# Patient Record
Sex: Female | Born: 1985 | Race: White | Hispanic: No | Marital: Single | Smoking: Never smoker
Health system: Southern US, Community
[De-identification: ages and names within clinical notes are randomized; demographics above are authoritative.]

## PROBLEM LIST (undated history)

## (undated) DIAGNOSIS — F32A Depression, unspecified: Secondary | ICD-10-CM

## (undated) DIAGNOSIS — J45909 Unspecified asthma, uncomplicated: Secondary | ICD-10-CM

## (undated) DIAGNOSIS — R51 Headache: Secondary | ICD-10-CM

## (undated) DIAGNOSIS — S332XXA Dislocation of sacroiliac and sacrococcygeal joint, initial encounter: Secondary | ICD-10-CM

## (undated) DIAGNOSIS — S52272A Monteggia's fracture of left ulna, initial encounter for closed fracture: Secondary | ICD-10-CM

## (undated) DIAGNOSIS — S32431A Displaced fracture of anterior column [iliopubic] of right acetabulum, initial encounter for closed fracture: Secondary | ICD-10-CM

## (undated) DIAGNOSIS — F329 Major depressive disorder, single episode, unspecified: Secondary | ICD-10-CM

## (undated) DIAGNOSIS — R519 Headache, unspecified: Secondary | ICD-10-CM

## (undated) DIAGNOSIS — S3282XA Multiple fractures of pelvis without disruption of pelvic ring, initial encounter for closed fracture: Secondary | ICD-10-CM

## (undated) DIAGNOSIS — K219 Gastro-esophageal reflux disease without esophagitis: Secondary | ICD-10-CM

## (undated) HISTORY — PX: TONSILLECTOMY: SUR1361

## (undated) HISTORY — PX: TUBAL LIGATION: SHX77

## (undated) HISTORY — PX: WISDOM TOOTH EXTRACTION: SHX21

---

## 2004-08-29 ENCOUNTER — Observation Stay: Payer: Self-pay | Admitting: Obstetrics & Gynecology

## 2004-09-12 ENCOUNTER — Emergency Department: Payer: Self-pay | Admitting: Emergency Medicine

## 2004-09-29 ENCOUNTER — Inpatient Hospital Stay: Payer: Self-pay

## 2005-07-26 ENCOUNTER — Ambulatory Visit: Payer: Self-pay

## 2006-04-11 ENCOUNTER — Observation Stay: Payer: Self-pay | Admitting: Obstetrics & Gynecology

## 2006-04-21 ENCOUNTER — Inpatient Hospital Stay: Payer: Self-pay

## 2013-10-05 ENCOUNTER — Emergency Department: Payer: Self-pay | Admitting: Emergency Medicine

## 2017-02-08 ENCOUNTER — Encounter (HOSPITAL_COMMUNITY): Admission: EM | Disposition: A | Discharge status: Home Health | Payer: Self-pay | Source: Home / Self Care

## 2017-02-08 ENCOUNTER — Inpatient Hospital Stay (HOSPITAL_COMMUNITY)
Admission: EM | Admit: 2017-02-08 | Discharge: 2017-02-15 | DRG: 958 | Disposition: A | Discharge status: Home Health | Payer: Medicaid Other | Attending: Orthopedic Surgery | Admitting: Orthopedic Surgery

## 2017-02-08 ENCOUNTER — Emergency Department (HOSPITAL_COMMUNITY): Payer: Medicaid Other

## 2017-02-08 ENCOUNTER — Inpatient Hospital Stay (HOSPITAL_COMMUNITY): Payer: Medicaid Other

## 2017-02-08 ENCOUNTER — Encounter (HOSPITAL_COMMUNITY): Payer: Self-pay

## 2017-02-08 ENCOUNTER — Inpatient Hospital Stay (HOSPITAL_COMMUNITY): Payer: Medicaid Other | Admitting: Certified Registered Nurse Anesthetist

## 2017-02-08 DIAGNOSIS — S32591A Other specified fracture of right pubis, initial encounter for closed fracture: Secondary | ICD-10-CM | POA: Diagnosis present

## 2017-02-08 DIAGNOSIS — F329 Major depressive disorder, single episode, unspecified: Secondary | ICD-10-CM | POA: Diagnosis present

## 2017-02-08 DIAGNOSIS — S52272A Monteggia's fracture of left ulna, initial encounter for closed fracture: Secondary | ICD-10-CM | POA: Diagnosis present

## 2017-02-08 DIAGNOSIS — S52279A Monteggia's fracture of unspecified ulna, initial encounter for closed fracture: Secondary | ICD-10-CM

## 2017-02-08 DIAGNOSIS — S32029A Unspecified fracture of second lumbar vertebra, initial encounter for closed fracture: Secondary | ICD-10-CM | POA: Diagnosis present

## 2017-02-08 DIAGNOSIS — Y92410 Unspecified street and highway as the place of occurrence of the external cause: Secondary | ICD-10-CM | POA: Diagnosis not present

## 2017-02-08 DIAGNOSIS — Z419 Encounter for procedure for purposes other than remedying health state, unspecified: Secondary | ICD-10-CM

## 2017-02-08 DIAGNOSIS — Z6841 Body Mass Index (BMI) 40.0 and over, adult: Secondary | ICD-10-CM

## 2017-02-08 DIAGNOSIS — S332XXD Dislocation of sacroiliac and sacrococcygeal joint, subsequent encounter: Secondary | ICD-10-CM | POA: Diagnosis not present

## 2017-02-08 DIAGNOSIS — S9031XA Contusion of right foot, initial encounter: Secondary | ICD-10-CM | POA: Diagnosis present

## 2017-02-08 DIAGNOSIS — S32039A Unspecified fracture of third lumbar vertebra, initial encounter for closed fracture: Secondary | ICD-10-CM | POA: Diagnosis present

## 2017-02-08 DIAGNOSIS — S332XXA Dislocation of sacroiliac and sacrococcygeal joint, initial encounter: Secondary | ICD-10-CM | POA: Diagnosis present

## 2017-02-08 DIAGNOSIS — S3282XA Multiple fractures of pelvis without disruption of pelvic ring, initial encounter for closed fracture: Secondary | ICD-10-CM | POA: Diagnosis present

## 2017-02-08 DIAGNOSIS — S32431A Displaced fracture of anterior column [iliopubic] of right acetabulum, initial encounter for closed fracture: Principal | ICD-10-CM | POA: Diagnosis present

## 2017-02-08 DIAGNOSIS — G8918 Other acute postprocedural pain: Secondary | ICD-10-CM | POA: Diagnosis not present

## 2017-02-08 DIAGNOSIS — S32049A Unspecified fracture of fourth lumbar vertebra, initial encounter for closed fracture: Secondary | ICD-10-CM | POA: Diagnosis present

## 2017-02-08 DIAGNOSIS — E876 Hypokalemia: Secondary | ICD-10-CM | POA: Diagnosis present

## 2017-02-08 DIAGNOSIS — S32811A Multiple fractures of pelvis with unstable disruption of pelvic ring, initial encounter for closed fracture: Secondary | ICD-10-CM | POA: Diagnosis not present

## 2017-02-08 DIAGNOSIS — S32592A Other specified fracture of left pubis, initial encounter for closed fracture: Secondary | ICD-10-CM | POA: Diagnosis present

## 2017-02-08 DIAGNOSIS — S32019A Unspecified fracture of first lumbar vertebra, initial encounter for closed fracture: Secondary | ICD-10-CM | POA: Diagnosis present

## 2017-02-08 DIAGNOSIS — S53005A Unspecified dislocation of left radial head, initial encounter: Secondary | ICD-10-CM | POA: Diagnosis present

## 2017-02-08 DIAGNOSIS — S3282XD Multiple fractures of pelvis without disruption of pelvic ring, subsequent encounter for fracture with routine healing: Secondary | ICD-10-CM | POA: Diagnosis not present

## 2017-02-08 DIAGNOSIS — S3730XA Unspecified injury of urethra, initial encounter: Secondary | ICD-10-CM | POA: Diagnosis present

## 2017-02-08 DIAGNOSIS — J45909 Unspecified asthma, uncomplicated: Secondary | ICD-10-CM

## 2017-02-08 DIAGNOSIS — R52 Pain, unspecified: Secondary | ICD-10-CM

## 2017-02-08 DIAGNOSIS — D62 Acute posthemorrhagic anemia: Secondary | ICD-10-CM | POA: Diagnosis not present

## 2017-02-08 DIAGNOSIS — Z09 Encounter for follow-up examination after completed treatment for conditions other than malignant neoplasm: Secondary | ICD-10-CM

## 2017-02-08 DIAGNOSIS — D72829 Elevated white blood cell count, unspecified: Secondary | ICD-10-CM

## 2017-02-08 DIAGNOSIS — S32810A Multiple fractures of pelvis with stable disruption of pelvic ring, initial encounter for closed fracture: Secondary | ICD-10-CM

## 2017-02-08 DIAGNOSIS — S52272D Monteggia's fracture of left ulna, subsequent encounter for closed fracture with routine healing: Secondary | ICD-10-CM | POA: Diagnosis not present

## 2017-02-08 DIAGNOSIS — Z881 Allergy status to other antibiotic agents status: Secondary | ICD-10-CM

## 2017-02-08 DIAGNOSIS — R791 Abnormal coagulation profile: Secondary | ICD-10-CM

## 2017-02-08 DIAGNOSIS — S32401A Unspecified fracture of right acetabulum, initial encounter for closed fracture: Secondary | ICD-10-CM

## 2017-02-08 DIAGNOSIS — R102 Pelvic and perineal pain: Secondary | ICD-10-CM | POA: Diagnosis present

## 2017-02-08 DIAGNOSIS — F32A Depression, unspecified: Secondary | ICD-10-CM

## 2017-02-08 HISTORY — DX: Dislocation of sacroiliac and sacrococcygeal joint, initial encounter: S33.2XXA

## 2017-02-08 HISTORY — PX: EXTERNAL FIXATION PELVIS: SHX1551

## 2017-02-08 HISTORY — DX: Major depressive disorder, single episode, unspecified: F32.9

## 2017-02-08 HISTORY — DX: Gastro-esophageal reflux disease without esophagitis: K21.9

## 2017-02-08 HISTORY — DX: Depression, unspecified: F32.A

## 2017-02-08 HISTORY — DX: Headache, unspecified: R51.9

## 2017-02-08 HISTORY — DX: Multiple fractures of pelvis without disruption of pelvic ring, initial encounter for closed fracture: S32.82XA

## 2017-02-08 HISTORY — DX: Headache: R51

## 2017-02-08 HISTORY — DX: Unspecified asthma, uncomplicated: J45.909

## 2017-02-08 HISTORY — DX: Monteggia's fracture of left ulna, initial encounter for closed fracture: S52.272A

## 2017-02-08 HISTORY — DX: Displaced fracture of anterior column (iliopubic) of right acetabulum, initial encounter for closed fracture: S32.431A

## 2017-02-08 LAB — COMPREHENSIVE METABOLIC PANEL
ALK PHOS: 46 U/L (ref 38–126)
ALT: 29 U/L (ref 14–54)
ANION GAP: 13 (ref 5–15)
AST: 49 U/L — ABNORMAL HIGH (ref 15–41)
Albumin: 3.9 g/dL (ref 3.5–5.0)
BUN: 11 mg/dL (ref 6–20)
CO2: 22 mmol/L (ref 22–32)
CREATININE: 1.11 mg/dL — AB (ref 0.44–1.00)
Calcium: 9 mg/dL (ref 8.9–10.3)
Chloride: 103 mmol/L (ref 101–111)
GFR calc non Af Amer: >60 mL/min (ref >60)
Glucose, Bld: 176 mg/dL — ABNORMAL HIGH (ref 65–99)
Potassium: 3.4 mmol/L — ABNORMAL LOW (ref 3.5–5.1)
SODIUM: 138 mmol/L (ref 135–145)
TOTAL PROTEIN: 6.4 g/dL — AB (ref 6.5–8.1)
Total Bilirubin: 0.4 mg/dL (ref 0.3–1.2)

## 2017-02-08 LAB — I-STAT CHEM 8, ED
BUN: 11 mg/dL (ref 6–20)
CALCIUM ION: 1.13 mmol/L — AB (ref 1.15–1.40)
CHLORIDE: 105 mmol/L (ref 101–111)
Creatinine, Ser: 1 mg/dL (ref 0.44–1.00)
Glucose, Bld: 175 mg/dL — ABNORMAL HIGH (ref 65–99)
HCT: 36 % (ref 36.0–46.0)
HEMOGLOBIN: 12.2 g/dL (ref 12.0–15.0)
Potassium: 3.4 mmol/L — ABNORMAL LOW (ref 3.5–5.1)
SODIUM: 140 mmol/L (ref 135–145)
TCO2: 24 mmol/L (ref 0–100)

## 2017-02-08 LAB — SAMPLE TO BLOOD BANK

## 2017-02-08 LAB — CBC
HCT: 35.8 % — ABNORMAL LOW (ref 36.0–46.0)
Hemoglobin: 12.2 g/dL (ref 12.0–15.0)
MCH: 29.5 pg (ref 26.0–34.0)
MCHC: 34.1 g/dL (ref 30.0–36.0)
MCV: 86.5 fL (ref 78.0–100.0)
PLATELETS: 363 10*3/uL (ref 150–400)
RBC: 4.14 MIL/uL (ref 3.87–5.11)
RDW: 12.8 % (ref 11.5–15.5)
WBC: 37.3 10*3/uL — AB (ref 4.0–10.5)

## 2017-02-08 LAB — PROTIME-INR
INR: 1.03
Prothrombin Time: 13.5 seconds (ref 11.4–15.2)

## 2017-02-08 LAB — I-STAT CG4 LACTIC ACID, ED: Lactic Acid, Venous: 1.82 mmol/L (ref 0.5–1.9)

## 2017-02-08 LAB — ETHANOL

## 2017-02-08 LAB — I-STAT BETA HCG BLOOD, ED (NOT ORDERABLE)
I-stat hCG, quantitative: 6.9 m[IU]/mL — ABNORMAL HIGH (ref <5)
I-stat hCG, quantitative: <5 m[IU]/mL (ref <5)

## 2017-02-08 LAB — CDS SEROLOGY

## 2017-02-08 LAB — PREPARE RBC (CROSSMATCH)

## 2017-02-08 SURGERY — EXTERNAL FIXATION, PELVIS
Anesthesia: General | Site: Pelvis

## 2017-02-08 MED ORDER — ONDANSETRON HCL 4 MG/2ML IJ SOLN
4.0000 mg | Freq: Four times a day (QID) | INTRAMUSCULAR | Status: DC | PRN
  Administered 2017-02-14: 4 mg via INTRAVENOUS
  Filled 2017-02-08: qty 2

## 2017-02-08 MED ORDER — HYDROMORPHONE HCL 1 MG/ML IJ SOLN
1.0000 mg | INTRAMUSCULAR | Status: DC | PRN
  Administered 2017-02-08 – 2017-02-10 (×6): 1 mg via INTRAVENOUS
  Filled 2017-02-08 (×6): qty 1

## 2017-02-08 MED ORDER — METHOCARBAMOL 500 MG PO TABS
500.0000 mg | ORAL_TABLET | Freq: Four times a day (QID) | ORAL | Status: DC | PRN
  Administered 2017-02-08 – 2017-02-15 (×2): 500 mg via ORAL
  Filled 2017-02-08 (×2): qty 1

## 2017-02-08 MED ORDER — PHENYLEPHRINE HCL 10 MG/ML IJ SOLN
INTRAVENOUS | Status: DC | PRN
  Administered 2017-02-08: 25 ug/min via INTRAVENOUS

## 2017-02-08 MED ORDER — CEFAZOLIN IN D5W 1 GM/50ML IV SOLN
1.0000 g | Freq: Four times a day (QID) | INTRAVENOUS | Status: AC
  Administered 2017-02-09 (×3): 1 g via INTRAVENOUS
  Administered 2017-02-09: 2 g via INTRAVENOUS
  Administered 2017-02-09: 1 g via INTRAVENOUS
  Filled 2017-02-08 (×4): qty 50

## 2017-02-08 MED ORDER — CEFOTETAN DISODIUM-DEXTROSE 2-2.08 GM-% IV SOLR
INTRAVENOUS | Status: AC
  Filled 2017-02-08: qty 50

## 2017-02-08 MED ORDER — PROPOFOL 10 MG/ML IV BOLUS
INTRAVENOUS | Status: DC | PRN
  Administered 2017-02-08: 150 mg via INTRAVENOUS

## 2017-02-08 MED ORDER — MIDAZOLAM HCL 2 MG/2ML IJ SOLN
INTRAMUSCULAR | Status: AC
  Filled 2017-02-08: qty 2

## 2017-02-08 MED ORDER — PROPOFOL 10 MG/ML IV BOLUS
INTRAVENOUS | Status: AC
  Filled 2017-02-08: qty 20

## 2017-02-08 MED ORDER — FENTANYL CITRATE (PF) 250 MCG/5ML IJ SOLN
INTRAMUSCULAR | Status: AC
  Filled 2017-02-08: qty 5

## 2017-02-08 MED ORDER — PHENYLEPHRINE HCL 10 MG/ML IJ SOLN
INTRAMUSCULAR | Status: DC | PRN
  Administered 2017-02-08: 80 ug via INTRAVENOUS
  Administered 2017-02-08: 40 ug via INTRAVENOUS

## 2017-02-08 MED ORDER — PANTOPRAZOLE SODIUM 40 MG PO TBEC
40.0000 mg | DELAYED_RELEASE_TABLET | Freq: Every day | ORAL | Status: DC
Start: 2017-02-08 — End: 2017-02-16
  Administered 2017-02-10 – 2017-02-15 (×6): 40 mg via ORAL
  Filled 2017-02-08 (×6): qty 1

## 2017-02-08 MED ORDER — LACTATED RINGERS IV SOLN
INTRAVENOUS | Status: DC | PRN
  Administered 2017-02-08: 18:00:00 via INTRAVENOUS

## 2017-02-08 MED ORDER — METOCLOPRAMIDE HCL 5 MG PO TABS: 5.0000 mg | ORAL_TABLET | Freq: Three times a day (TID) | ORAL | Status: DC | PRN

## 2017-02-08 MED ORDER — ONDANSETRON HCL 4 MG/2ML IJ SOLN: 4.0000 mg | Freq: Four times a day (QID) | INTRAMUSCULAR | Status: DC | PRN

## 2017-02-08 MED ORDER — FENTANYL CITRATE (PF) 100 MCG/2ML IJ SOLN
INTRAMUSCULAR | Status: DC | PRN
  Administered 2017-02-08: 100 ug via INTRAVENOUS

## 2017-02-08 MED ORDER — ONDANSETRON HCL 4 MG/2ML IJ SOLN: 4.0000 mg | Freq: Once | INTRAMUSCULAR | Status: DC | PRN

## 2017-02-08 MED ORDER — OXYCODONE HCL 5 MG PO TABS
5.0000 mg | ORAL_TABLET | ORAL | Status: DC | PRN
  Administered 2017-02-09: 5 mg via ORAL
  Administered 2017-02-09: 10 mg via ORAL
  Filled 2017-02-08: qty 1
  Filled 2017-02-08: qty 2

## 2017-02-08 MED ORDER — ONDANSETRON HCL 4 MG PO TABS
4.0000 mg | ORAL_TABLET | Freq: Four times a day (QID) | ORAL | Status: DC | PRN
  Administered 2017-02-13: 4 mg via ORAL
  Filled 2017-02-08: qty 1

## 2017-02-08 MED ORDER — IOPAMIDOL (ISOVUE-300) INJECTION 61%
INTRAVENOUS | Status: AC
  Administered 2017-02-08: 100 mL
  Filled 2017-02-08: qty 100

## 2017-02-08 MED ORDER — ROCURONIUM BROMIDE 10 MG/ML (PF) SYRINGE
PREFILLED_SYRINGE | INTRAVENOUS | Status: AC
  Filled 2017-02-08: qty 5

## 2017-02-08 MED ORDER — DOCUSATE SODIUM 100 MG PO CAPS
100.0000 mg | ORAL_CAPSULE | Freq: Two times a day (BID) | ORAL | Status: DC
  Administered 2017-02-08 – 2017-02-15 (×13): 100 mg via ORAL
  Filled 2017-02-08 (×13): qty 1

## 2017-02-08 MED ORDER — METHOCARBAMOL 1000 MG/10ML IJ SOLN
500.0000 mg | Freq: Four times a day (QID) | INTRAVENOUS | Status: DC | PRN
  Filled 2017-02-08: qty 5

## 2017-02-08 MED ORDER — SODIUM CHLORIDE 0.9 % IV SOLN: Freq: Once | INTRAVENOUS | Status: DC

## 2017-02-08 MED ORDER — LIDOCAINE 2% (20 MG/ML) 5 ML SYRINGE
INTRAMUSCULAR | Status: AC
  Filled 2017-02-08: qty 5

## 2017-02-08 MED ORDER — ACETAMINOPHEN 325 MG PO TABS: 650.0000 mg | ORAL_TABLET | Freq: Four times a day (QID) | ORAL | Status: DC | PRN

## 2017-02-08 MED ORDER — BISACODYL 10 MG RE SUPP: 10.0000 mg | Freq: Every day | RECTAL | Status: DC | PRN

## 2017-02-08 MED ORDER — HYDROMORPHONE HCL 1 MG/ML IJ SOLN
INTRAMUSCULAR | Status: AC
  Filled 2017-02-08: qty 1

## 2017-02-08 MED ORDER — MAGNESIUM CITRATE PO SOLN: 1.0000 | Freq: Once | ORAL | Status: DC | PRN

## 2017-02-08 MED ORDER — ONDANSETRON HCL 4 MG/2ML IJ SOLN
INTRAMUSCULAR | Status: DC | PRN
  Administered 2017-02-08: 4 mg via INTRAVENOUS

## 2017-02-08 MED ORDER — ONDANSETRON HCL 4 MG/2ML IJ SOLN
INTRAMUSCULAR | Status: AC
  Administered 2017-02-08: 4 mg via INTRAVENOUS
  Filled 2017-02-08: qty 2

## 2017-02-08 MED ORDER — MEPERIDINE HCL 25 MG/ML IJ SOLN: 6.2500 mg | INTRAMUSCULAR | Status: DC | PRN

## 2017-02-08 MED ORDER — SODIUM CHLORIDE 0.9 % IV SOLN: INTRAVENOUS | Status: DC

## 2017-02-08 MED ORDER — SUGAMMADEX SODIUM 200 MG/2ML IV SOLN
INTRAVENOUS | Status: DC | PRN
  Administered 2017-02-08: 172.4 mg via INTRAVENOUS

## 2017-02-08 MED ORDER — HYDROMORPHONE HCL 1 MG/ML IJ SOLN
0.2500 mg | INTRAMUSCULAR | Status: DC | PRN
  Administered 2017-02-08 (×2): 0.25 mg via INTRAVENOUS
  Administered 2017-02-08: 0.5 mg via INTRAVENOUS

## 2017-02-08 MED ORDER — ROCURONIUM BROMIDE 100 MG/10ML IV SOLN
INTRAVENOUS | Status: DC | PRN
  Administered 2017-02-08: 30 mg via INTRAVENOUS

## 2017-02-08 MED ORDER — PANTOPRAZOLE SODIUM 40 MG IV SOLR: 40.0000 mg | Freq: Every day | INTRAVENOUS | Status: DC

## 2017-02-08 MED ORDER — SODIUM CHLORIDE 0.9 % IV SOLN
INTRAVENOUS | Status: DC
  Administered 2017-02-08 – 2017-02-09 (×5): via INTRAVENOUS

## 2017-02-08 MED ORDER — SUCCINYLCHOLINE CHLORIDE 20 MG/ML IJ SOLN
INTRAMUSCULAR | Status: DC | PRN
  Administered 2017-02-08: 100 mg via INTRAVENOUS

## 2017-02-08 MED ORDER — SODIUM CHLORIDE 0.9 % IV BOLUS (SEPSIS)
125.0000 mL | Freq: Once | INTRAVENOUS | Status: AC
  Administered 2017-02-08: 125 mL via INTRAVENOUS

## 2017-02-08 MED ORDER — FENTANYL CITRATE (PF) 100 MCG/2ML IJ SOLN
50.0000 ug | Freq: Once | INTRAMUSCULAR | Status: AC
  Administered 2017-02-08: 50 ug via INTRAVENOUS

## 2017-02-08 MED ORDER — 0.9 % SODIUM CHLORIDE (POUR BTL) OPTIME
TOPICAL | Status: DC | PRN
  Administered 2017-02-08: 1000 mL

## 2017-02-08 MED ORDER — ACETAMINOPHEN 650 MG RE SUPP: 650.0000 mg | Freq: Four times a day (QID) | RECTAL | Status: DC | PRN

## 2017-02-08 MED ORDER — SUGAMMADEX SODIUM 200 MG/2ML IV SOLN
INTRAVENOUS | Status: AC
  Filled 2017-02-08: qty 4

## 2017-02-08 MED ORDER — ONDANSETRON HCL 4 MG/2ML IJ SOLN
INTRAMUSCULAR | Status: AC
  Filled 2017-02-08: qty 2

## 2017-02-08 MED ORDER — LIDOCAINE HCL (CARDIAC) 20 MG/ML IV SOLN
INTRAVENOUS | Status: DC | PRN
  Administered 2017-02-08: 100 mg via INTRAVENOUS

## 2017-02-08 MED ORDER — POLYETHYLENE GLYCOL 3350 17 G PO PACK: 17.0000 g | PACK | Freq: Every day | ORAL | Status: DC | PRN

## 2017-02-08 MED ORDER — ONDANSETRON HCL 4 MG/2ML IJ SOLN
4.0000 mg | Freq: Once | INTRAMUSCULAR | Status: AC
  Administered 2017-02-08: 4 mg via INTRAVENOUS

## 2017-02-08 MED ORDER — HYDROMORPHONE HCL 1 MG/ML IJ SOLN: 1.0000 mg | INTRAMUSCULAR | Status: DC | PRN

## 2017-02-08 MED ORDER — SUCCINYLCHOLINE CHLORIDE 200 MG/10ML IV SOSY
PREFILLED_SYRINGE | INTRAVENOUS | Status: AC
  Filled 2017-02-08: qty 10

## 2017-02-08 MED ORDER — CEFAZOLIN SODIUM-DEXTROSE 2-3 GM-% IV SOLR
INTRAVENOUS | Status: DC | PRN
  Administered 2017-02-08: 2 g via INTRAVENOUS

## 2017-02-08 MED ORDER — METOCLOPRAMIDE HCL 5 MG/ML IJ SOLN: 5.0000 mg | Freq: Three times a day (TID) | INTRAMUSCULAR | Status: DC | PRN

## 2017-02-08 MED ORDER — MIDAZOLAM HCL 5 MG/5ML IJ SOLN
INTRAMUSCULAR | Status: DC | PRN
  Administered 2017-02-08: 2 mg via INTRAVENOUS

## 2017-02-08 MED ORDER — FENTANYL CITRATE (PF) 100 MCG/2ML IJ SOLN
INTRAMUSCULAR | Status: AC
  Administered 2017-02-08: 50 ug via INTRAVENOUS
  Filled 2017-02-08: qty 2

## 2017-02-08 MED ORDER — ONDANSETRON HCL 4 MG PO TABS: 4.0000 mg | ORAL_TABLET | Freq: Four times a day (QID) | ORAL | Status: DC | PRN

## 2017-02-08 SURGICAL SUPPLY — 39 items
BAR GLASS FIBER EXFX 11X350 (EXFIX) ×6 IMPLANT
BNDG GAUZE ELAST 4 BULKY (GAUZE/BANDAGES/DRESSINGS) ×3 IMPLANT
CLAMP BLUE BAR TO BAR (EXFIX) ×6 IMPLANT
CLAMP PIN 45MM 1 BAR (EXFIX) ×6 IMPLANT
COVER SURGICAL LIGHT HANDLE (MISCELLANEOUS) ×3 IMPLANT
DRAPE C-ARM 42X72 X-RAY (DRAPES) ×3 IMPLANT
DRAPE ORTHO SPLIT 77X108 STRL (DRAPES) ×4
DRAPE SURG ORHT 6 SPLT 77X108 (DRAPES) ×2 IMPLANT
DRAPE U-SHAPE 47X51 STRL (DRAPES) ×6 IMPLANT
DRSG ADAPTIC 3X8 NADH LF (GAUZE/BANDAGES/DRESSINGS) ×3 IMPLANT
DRSG PAD ABDOMINAL 8X10 ST (GAUZE/BANDAGES/DRESSINGS) ×3 IMPLANT
DURAPREP 26ML APPLICATOR (WOUND CARE) ×3 IMPLANT
ELECT REM PT RETURN 9FT ADLT (ELECTROSURGICAL) ×3
ELECTRODE REM PT RTRN 9FT ADLT (ELECTROSURGICAL) ×1 IMPLANT
GAUZE SPONGE 4X4 12PLY STRL (GAUZE/BANDAGES/DRESSINGS) ×3 IMPLANT
GLOVE BIOGEL PI IND STRL 6.5 (GLOVE) ×2 IMPLANT
GLOVE BIOGEL PI IND STRL 7.0 (GLOVE) ×2 IMPLANT
GLOVE BIOGEL PI IND STRL 9 (GLOVE) ×1 IMPLANT
GLOVE BIOGEL PI INDICATOR 6.5 (GLOVE) ×4
GLOVE BIOGEL PI INDICATOR 7.0 (GLOVE) ×4
GLOVE BIOGEL PI INDICATOR 9 (GLOVE) ×2
GLOVE SKINSENSE NS SZ6.5 (GLOVE) ×2
GLOVE SKINSENSE STRL SZ6.5 (GLOVE) ×1 IMPLANT
GLOVE SURG ORTHO 9.0 STRL STRW (GLOVE) ×3 IMPLANT
GOWN STRL REUS W/ TWL XL LVL3 (GOWN DISPOSABLE) ×3 IMPLANT
GOWN STRL REUS W/TWL XL LVL3 (GOWN DISPOSABLE) ×6
KIT BASIN OR (CUSTOM PROCEDURE TRAY) ×3 IMPLANT
KIT ROOM TURNOVER OR (KITS) ×3 IMPLANT
MANIFOLD NEPTUNE II (INSTRUMENTS) ×3 IMPLANT
NS IRRIG 1000ML POUR BTL (IV SOLUTION) ×3 IMPLANT
PACK GENERAL/GYN (CUSTOM PROCEDURE TRAY) ×3 IMPLANT
PAD ARMBOARD 7.5X6 YLW CONV (MISCELLANEOUS) ×9 IMPLANT
PIN HALF 5X200X85MM EXFIX (EXFIX) ×12 IMPLANT
SUT ETHILON 3 0 FSLX (SUTURE) IMPLANT
SUT VIC AB 2-0 CTB1 (SUTURE) IMPLANT
TOWEL OR 17X24 6PK STRL BLUE (TOWEL DISPOSABLE) ×3 IMPLANT
TOWEL OR 17X26 10 PK STRL BLUE (TOWEL DISPOSABLE) ×3 IMPLANT
UNDERPAD 30X30 (UNDERPADS AND DIAPERS) ×3 IMPLANT
WATER STERILE IRR 1000ML POUR (IV SOLUTION) ×3 IMPLANT
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
## 2017-02-08 NOTE — Transfer of Care (Signed)
Immediate Anesthesia Transfer of Care Note  Patient: Penny Long  Procedure(s) Performed: Procedure(s): EXTERNAL FIXATION PELVIS (N/A)  Patient Location: PACU  Anesthesia Type:General  Level of Consciousness: awake and alert   Airway & Oxygen Therapy: Patient Spontanous Breathing and Patient connected to nasal cannula oxygen  Post-op Assessment: Report given to RN and Post -op Vital signs reviewed and stable  Post vital signs: Reviewed and stable  Last Vitals:  Vitals:   02/08/17 1715 02/08/17 1923  BP: 114/75   Pulse: 74   Resp: 19   Temp:  36.2 C    Last Pain:  Vitals:   02/08/17 1606  TempSrc:   PainSc: 10-Worst pain ever         Complications: No apparent anesthesia complications
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
## 2017-02-08 NOTE — ED Notes (Addendum)
Patient to CT with RN and NT °
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
## 2017-02-08 NOTE — Progress Notes (Signed)
Sister Selena Batten  updated per pt request

## 2017-02-08 NOTE — Progress Notes (Signed)
Orthopedic Tech Progress Note Patient Details:  Penny Long 22-Dec-1985 [id number]  Ortho Devices Type of Ortho Device: Ace wrap, Post (long arm) splint Ortho Device/Splint Location: LUE Ortho Device/Splint Interventions: Ordered, Application   Jennye Moccasin 02/08/2017, 8:12 PM
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
## 2017-02-08 NOTE — Progress Notes (Signed)
Upon arrival to short stay from ED, pt requested to void.  Assisted pt to use female urinal.  Pt voided 300 cc of bloody urine.  Specimen cup saved.  Made Dr. Lajoyce Corners aware of bloody urine.

## 2017-02-08 NOTE — Anesthesia Procedure Notes (Signed)
Procedure Name: Intubation Date/Time: 02/08/2017 6:19 PM Performed by: Gwenyth Allegra Pre-anesthesia Checklist: Patient identified, Emergency Drugs available, Suction available, Patient being monitored and Timeout performed Patient Re-evaluated:Patient Re-evaluated prior to inductionOxygen Delivery Method: Circle system utilized Intubation Type: IV induction, Rapid sequence and Cricoid Pressure applied Tube size: 7.0 mm Number of attempts: 1 Airway Equipment and Method: Stylet Placement Confirmation: ETT inserted through vocal cords under direct vision,  positive ETCO2 and breath sounds checked- equal and bilateral Secured at: 21 cm Tube secured with: Tape Dental Injury: Teeth and Oropharynx as per pre-operative assessment

## 2017-02-08 NOTE — ED Notes (Signed)
All belongings with sister.
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
## 2017-02-08 NOTE — Anesthesia Preprocedure Evaluation (Addendum)
Anesthesia Evaluation  Patient identified by MRN, date of birth, ID band Patient awake    Reviewed: Allergy & Precautions, NPO status , Patient's Chart, lab work & pertinent test results  Airway Mallampati: I  TM Distance: >3 FB Neck ROM: Full    Dental   Pulmonary neg pulmonary ROS,    Pulmonary exam normal breath sounds clear to auscultation       Cardiovascular negative cardio ROS Normal cardiovascular exam Rhythm:Regular Rate:Normal     Neuro/Psych Depression negative neurological ROS  negative psych ROS   GI/Hepatic negative GI ROS, Neg liver ROS,   Endo/Other  negative endocrine ROS  Renal/GU negative Renal ROS     Musculoskeletal negative musculoskeletal ROS (+)   Abdominal   Peds  Hematology negative hematology ROS (+)   Anesthesia Other Findings   Reproductive/Obstetrics negative OB ROS                                                             Anesthesia Evaluation  Patient identified by MRN, date of birth, ID band Patient awake    Reviewed: Allergy & Precautions, NPO status , Patient's Chart, lab work & pertinent test results  Airway Mallampati: I  TM Distance: >3 FB Neck ROM: Full    Dental   Pulmonary    Pulmonary exam normal        Cardiovascular Normal cardiovascular exam     Neuro/Psych Depression    GI/Hepatic   Endo/Other    Renal/GU      Musculoskeletal   Abdominal   Peds  Hematology   Anesthesia Other Findings   Reproductive/Obstetrics                             Anesthesia Physical Anesthesia Plan  ASA: II  Anesthesia Plan: General   Post-op Pain Management:    Induction: Intravenous  Airway Management Planned: Oral ETT  Additional Equipment:   Intra-op Plan:   Post-operative Plan: Extubation in OR  Informed Consent: I have reviewed the patients History and Physical, chart, labs and  discussed the procedure including the risks, benefits and alternatives for the proposed anesthesia with the patient or authorized representative who has indicated his/her understanding and acceptance.     Plan Discussed with: CRNA and Surgeon  Anesthesia Plan Comments:         Anesthesia Quick Evaluation  Anesthesia Physical Anesthesia Plan  ASA: II  Anesthesia Plan: General   Post-op Pain Management:    Induction: Intravenous  Airway Management Planned: Oral ETT  Additional Equipment:   Intra-op Plan:   Post-operative Plan: Extubation in OR  Informed Consent: I have reviewed the patients History and Physical, chart, labs and discussed the procedure including the risks, benefits and alternatives for the proposed anesthesia with the patient or authorized representative who has indicated his/her understanding and acceptance.   Dental advisory given  Plan Discussed with: CRNA  Anesthesia Plan Comments:        Anesthesia Quick Evaluation
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
## 2017-02-09 ENCOUNTER — Inpatient Hospital Stay (HOSPITAL_COMMUNITY): Payer: Medicaid Other

## 2017-02-09 ENCOUNTER — Inpatient Hospital Stay (HOSPITAL_COMMUNITY): Payer: Medicaid Other | Admitting: Certified Registered Nurse Anesthetist

## 2017-02-09 ENCOUNTER — Encounter (HOSPITAL_COMMUNITY): Payer: Self-pay | Admitting: *Deleted

## 2017-02-09 ENCOUNTER — Encounter (HOSPITAL_COMMUNITY): Admission: EM | Disposition: A | Discharge status: Home Health | Payer: Self-pay | Source: Home / Self Care

## 2017-02-09 HISTORY — PX: ORIF PELVIC FRACTURE: SHX2128

## 2017-02-09 HISTORY — PX: ORIF ELBOW FRACTURE: SHX5031

## 2017-02-09 LAB — CBC
HCT: 26.8 % — ABNORMAL LOW (ref 36.0–46.0)
HEMATOCRIT: 26.9 % — AB (ref 36.0–46.0)
Hemoglobin: 9.3 g/dL — ABNORMAL LOW (ref 12.0–15.0)
Hemoglobin: 9.1 g/dL — ABNORMAL LOW (ref 12.0–15.0)
MCH: 30.4 pg (ref 26.0–34.0)
MCH: 28.9 pg (ref 26.0–34.0)
MCHC: 34.6 g/dL (ref 30.0–36.0)
MCHC: 34 g/dL (ref 30.0–36.0)
MCV: 87.9 fL (ref 78.0–100.0)
MCV: 85.1 fL (ref 78.0–100.0)
PLATELETS: 233 10*3/uL (ref 150–400)
PLATELETS: 171 10*3/uL (ref 150–400)
RBC: 3.06 MIL/uL — ABNORMAL LOW (ref 3.87–5.11)
RBC: 3.15 MIL/uL — ABNORMAL LOW (ref 3.87–5.11)
RDW: 13.5 % (ref 11.5–15.5)
RDW: 14.3 % (ref 11.5–15.5)
WBC: 13.4 10*3/uL — AB (ref 4.0–10.5)
WBC: 14.1 10*3/uL — ABNORMAL HIGH (ref 4.0–10.5)

## 2017-02-09 LAB — BASIC METABOLIC PANEL
Anion gap: 15 (ref 5–15)
BUN: 9 mg/dL (ref 6–20)
CHLORIDE: 104 mmol/L (ref 101–111)
CO2: 19 mmol/L — ABNORMAL LOW (ref 22–32)
Calcium: 8.5 mg/dL — ABNORMAL LOW (ref 8.9–10.3)
Creatinine, Ser: 0.87 mg/dL (ref 0.44–1.00)
GFR calc Af Amer: >60 mL/min (ref >60)
GLUCOSE: 105 mg/dL — AB (ref 65–99)
POTASSIUM: 3.5 mmol/L (ref 3.5–5.1)
Sodium: 138 mmol/L (ref 135–145)

## 2017-02-09 LAB — URINALYSIS, ROUTINE W REFLEX MICROSCOPIC
Bilirubin Urine: NEGATIVE
GLUCOSE, UA: NEGATIVE mg/dL
Ketones, ur: NEGATIVE mg/dL
NITRITE: NEGATIVE
PH: 5 (ref 5.0–8.0)
Protein, ur: NEGATIVE mg/dL
Specific Gravity, Urine: 1.019 (ref 1.005–1.030)
Squamous Epithelial / LPF: NONE SEEN

## 2017-02-09 LAB — CREATININE, SERUM
CREATININE: 0.72 mg/dL (ref 0.44–1.00)
GFR calc Af Amer: >60 mL/min (ref >60)
GFR calc non Af Amer: >60 mL/min (ref >60)

## 2017-02-09 LAB — PREPARE RBC (CROSSMATCH)

## 2017-02-09 LAB — ABO/RH: ABO/RH(D): AB POS

## 2017-02-09 LAB — MRSA PCR SCREENING: MRSA by PCR: NEGATIVE

## 2017-02-09 LAB — LACTIC ACID, PLASMA: Lactic Acid, Venous: 2.4 mmol/L (ref 0.5–1.9)

## 2017-02-09 SURGERY — OPEN REDUCTION INTERNAL FIXATION (ORIF) PELVIC FRACTURE
Anesthesia: General

## 2017-02-09 MED ORDER — SODIUM CHLORIDE 0.9 % IV SOLN
INTRAVENOUS | Status: DC | PRN
  Administered 2017-02-09: 14:00:00 via INTRAVENOUS

## 2017-02-09 MED ORDER — NEOSTIGMINE METHYLSULFATE 5 MG/5ML IV SOSY
PREFILLED_SYRINGE | INTRAVENOUS | Status: AC
  Filled 2017-02-09: qty 5

## 2017-02-09 MED ORDER — LIDOCAINE 2% (20 MG/ML) 5 ML SYRINGE
INTRAMUSCULAR | Status: AC
  Filled 2017-02-09: qty 5

## 2017-02-09 MED ORDER — OXYCODONE HCL 5 MG PO TABS
10.0000 mg | ORAL_TABLET | ORAL | Status: DC | PRN
  Administered 2017-02-09: 10 mg via ORAL
  Administered 2017-02-10 – 2017-02-12 (×9): 15 mg via ORAL
  Administered 2017-02-12: 10 mg via ORAL
  Administered 2017-02-13 – 2017-02-15 (×10): 15 mg via ORAL
  Filled 2017-02-09 (×12): qty 3
  Filled 2017-02-09: qty 2
  Filled 2017-02-09 (×2): qty 3
  Filled 2017-02-09: qty 2
  Filled 2017-02-09 (×3): qty 3
  Filled 2017-02-09: qty 2
  Filled 2017-02-09 (×2): qty 3

## 2017-02-09 MED ORDER — ROCURONIUM BROMIDE 10 MG/ML (PF) SYRINGE
PREFILLED_SYRINGE | INTRAVENOUS | Status: AC
  Filled 2017-02-09: qty 10

## 2017-02-09 MED ORDER — PNEUMOCOCCAL VAC POLYVALENT 25 MCG/0.5ML IJ INJ
0.5000 mL | INJECTION | INTRAMUSCULAR | Status: AC
  Administered 2017-02-10: 0.5 mL via INTRAMUSCULAR

## 2017-02-09 MED ORDER — ONDANSETRON HCL 4 MG/2ML IJ SOLN: 4.0000 mg | Freq: Four times a day (QID) | INTRAMUSCULAR | Status: DC | PRN

## 2017-02-09 MED ORDER — METOCLOPRAMIDE HCL 5 MG/ML IJ SOLN: 5.0000 mg | Freq: Three times a day (TID) | INTRAMUSCULAR | Status: DC | PRN

## 2017-02-09 MED ORDER — ONDANSETRON HCL 4 MG/2ML IJ SOLN
INTRAMUSCULAR | Status: AC
  Filled 2017-02-09: qty 2

## 2017-02-09 MED ORDER — HYDROMORPHONE HCL 1 MG/ML IJ SOLN
0.2500 mg | INTRAMUSCULAR | Status: DC | PRN
  Administered 2017-02-09 (×2): 0.5 mg via INTRAVENOUS

## 2017-02-09 MED ORDER — WARFARIN SODIUM 10 MG PO TABS
10.0000 mg | ORAL_TABLET | Freq: Once | ORAL | Status: DC
  Filled 2017-02-09: qty 1

## 2017-02-09 MED ORDER — COUMADIN BOOK
Freq: Once | Status: AC
  Administered 2017-02-09: 1
  Filled 2017-02-09: qty 1

## 2017-02-09 MED ORDER — DEXAMETHASONE SODIUM PHOSPHATE 10 MG/ML IJ SOLN
INTRAMUSCULAR | Status: DC | PRN
  Administered 2017-02-09: 10 mg via INTRAVENOUS

## 2017-02-09 MED ORDER — HYDROMORPHONE HCL 1 MG/ML IJ SOLN
INTRAMUSCULAR | Status: AC
  Administered 2017-02-09: 0.5 mg via INTRAVENOUS
  Filled 2017-02-09: qty 1

## 2017-02-09 MED ORDER — FENTANYL CITRATE (PF) 250 MCG/5ML IJ SOLN
INTRAMUSCULAR | Status: AC
  Filled 2017-02-09: qty 5

## 2017-02-09 MED ORDER — METOCLOPRAMIDE HCL 5 MG PO TABS: 5.0000 mg | ORAL_TABLET | Freq: Three times a day (TID) | ORAL | Status: DC | PRN

## 2017-02-09 MED ORDER — PHENYLEPHRINE 40 MCG/ML (10ML) SYRINGE FOR IV PUSH (FOR BLOOD PRESSURE SUPPORT)
PREFILLED_SYRINGE | INTRAVENOUS | Status: AC
  Filled 2017-02-09: qty 10

## 2017-02-09 MED ORDER — ROCURONIUM BROMIDE 10 MG/ML (PF) SYRINGE
PREFILLED_SYRINGE | INTRAVENOUS | Status: AC
  Filled 2017-02-09: qty 5

## 2017-02-09 MED ORDER — ACETAMINOPHEN 650 MG RE SUPP: 650.0000 mg | Freq: Four times a day (QID) | RECTAL | Status: DC | PRN

## 2017-02-09 MED ORDER — OXYCODONE-ACETAMINOPHEN 5-325 MG PO TABS
1.0000 | ORAL_TABLET | Freq: Four times a day (QID) | ORAL | Status: DC | PRN
  Administered 2017-02-10 – 2017-02-15 (×16): 2 via ORAL
  Filled 2017-02-09 (×16): qty 2

## 2017-02-09 MED ORDER — CEFAZOLIN SODIUM-DEXTROSE 2-4 GM/100ML-% IV SOLN
2.0000 g | Freq: Four times a day (QID) | INTRAVENOUS | Status: AC
  Administered 2017-02-09 – 2017-02-10 (×3): 2 g via INTRAVENOUS
  Filled 2017-02-09 (×3): qty 100

## 2017-02-09 MED ORDER — ACETAMINOPHEN 325 MG PO TABS: 650.0000 mg | ORAL_TABLET | Freq: Four times a day (QID) | ORAL | Status: DC | PRN

## 2017-02-09 MED ORDER — ALBUTEROL SULFATE HFA 108 (90 BASE) MCG/ACT IN AERS
INHALATION_SPRAY | RESPIRATORY_TRACT | Status: DC | PRN
  Administered 2017-02-09: 5 via RESPIRATORY_TRACT
  Administered 2017-02-09: 2 via RESPIRATORY_TRACT

## 2017-02-09 MED ORDER — WARFARIN - PHARMACIST DOSING INPATIENT
Freq: Every day | Status: DC
  Administered 2017-02-10 – 2017-02-15 (×3)

## 2017-02-09 MED ORDER — PROMETHAZINE HCL 25 MG/ML IJ SOLN: 6.2500 mg | INTRAMUSCULAR | Status: DC | PRN

## 2017-02-09 MED ORDER — DEXAMETHASONE SODIUM PHOSPHATE 10 MG/ML IJ SOLN
INTRAMUSCULAR | Status: AC
  Filled 2017-02-09: qty 1

## 2017-02-09 MED ORDER — LIDOCAINE 2% (20 MG/ML) 5 ML SYRINGE
INTRAMUSCULAR | Status: DC | PRN
  Administered 2017-02-09: 100 mg via INTRAVENOUS

## 2017-02-09 MED ORDER — ROCURONIUM BROMIDE 10 MG/ML (PF) SYRINGE
PREFILLED_SYRINGE | INTRAVENOUS | Status: DC | PRN
  Administered 2017-02-09: 30 mg via INTRAVENOUS
  Administered 2017-02-09: 50 mg via INTRAVENOUS
  Administered 2017-02-09: 30 mg via INTRAVENOUS
  Administered 2017-02-09 (×4): 50 mg via INTRAVENOUS

## 2017-02-09 MED ORDER — PROPOFOL 10 MG/ML IV BOLUS
INTRAVENOUS | Status: DC | PRN
  Administered 2017-02-09: 150 mg via INTRAVENOUS

## 2017-02-09 MED ORDER — LACTATED RINGERS IV SOLN
INTRAVENOUS | Status: DC
  Administered 2017-02-09 (×3): via INTRAVENOUS

## 2017-02-09 MED ORDER — ENOXAPARIN SODIUM 40 MG/0.4ML ~~LOC~~ SOLN
40.0000 mg | SUBCUTANEOUS | Status: DC
  Administered 2017-02-10 – 2017-02-11 (×2): 40 mg via SUBCUTANEOUS
  Filled 2017-02-09 (×2): qty 0.4

## 2017-02-09 MED ORDER — MIDAZOLAM HCL 5 MG/5ML IJ SOLN
INTRAMUSCULAR | Status: DC | PRN
  Administered 2017-02-09: 2 mg via INTRAVENOUS

## 2017-02-09 MED ORDER — SUGAMMADEX SODIUM 200 MG/2ML IV SOLN
INTRAVENOUS | Status: DC | PRN
  Administered 2017-02-09: 200 mg via INTRAVENOUS

## 2017-02-09 MED ORDER — SUCCINYLCHOLINE CHLORIDE 200 MG/10ML IV SOSY
PREFILLED_SYRINGE | INTRAVENOUS | Status: AC
  Filled 2017-02-09: qty 10

## 2017-02-09 MED ORDER — METHOCARBAMOL 500 MG PO TABS
1000.0000 mg | ORAL_TABLET | Freq: Four times a day (QID) | ORAL | Status: DC
  Administered 2017-02-09 – 2017-02-15 (×25): 1000 mg via ORAL
  Filled 2017-02-09 (×25): qty 2

## 2017-02-09 MED ORDER — FENTANYL CITRATE (PF) 100 MCG/2ML IJ SOLN
INTRAMUSCULAR | Status: DC | PRN
  Administered 2017-02-09 (×4): 50 ug via INTRAVENOUS
  Administered 2017-02-09: 100 ug via INTRAVENOUS
  Administered 2017-02-09 (×2): 50 ug via INTRAVENOUS
  Administered 2017-02-09: 100 ug via INTRAVENOUS
  Administered 2017-02-09 (×2): 50 ug via INTRAVENOUS
  Administered 2017-02-09: 150 ug via INTRAVENOUS

## 2017-02-09 MED ORDER — PROPOFOL 10 MG/ML IV BOLUS
INTRAVENOUS | Status: AC
  Filled 2017-02-09: qty 40

## 2017-02-09 MED ORDER — METHOCARBAMOL 1000 MG/10ML IJ SOLN
1000.0000 mg | Freq: Four times a day (QID) | INTRAVENOUS | Status: DC
  Filled 2017-02-09 (×25): qty 10

## 2017-02-09 MED ORDER — SUGAMMADEX SODIUM 200 MG/2ML IV SOLN
INTRAVENOUS | Status: AC
  Filled 2017-02-09: qty 4

## 2017-02-09 MED ORDER — CEFAZOLIN SODIUM 1 G IJ SOLR
INTRAMUSCULAR | Status: AC
  Filled 2017-02-09: qty 10

## 2017-02-09 MED ORDER — ONDANSETRON HCL 4 MG PO TABS: 4.0000 mg | ORAL_TABLET | Freq: Four times a day (QID) | ORAL | Status: DC | PRN

## 2017-02-09 MED ORDER — CEFAZOLIN SODIUM 1 G IJ SOLR
INTRAMUSCULAR | Status: AC
  Filled 2017-02-09: qty 20

## 2017-02-09 MED ORDER — ALBUTEROL SULFATE HFA 108 (90 BASE) MCG/ACT IN AERS
INHALATION_SPRAY | RESPIRATORY_TRACT | Status: AC
  Filled 2017-02-09: qty 6.7

## 2017-02-09 MED ORDER — MIDAZOLAM HCL 2 MG/2ML IJ SOLN
INTRAMUSCULAR | Status: AC
  Filled 2017-02-09: qty 2

## 2017-02-09 MED ORDER — SODIUM CHLORIDE 0.9 % IV SOLN: 10.0000 mL/h | Freq: Once | INTRAVENOUS | Status: DC

## 2017-02-09 MED ORDER — ALBUMIN HUMAN 5 % IV SOLN
INTRAVENOUS | Status: DC | PRN
  Administered 2017-02-09: 12:00:00 via INTRAVENOUS

## 2017-02-09 MED ORDER — 0.9 % SODIUM CHLORIDE (POUR BTL) OPTIME
TOPICAL | Status: DC | PRN
  Administered 2017-02-09 (×2): 1000 mL

## 2017-02-09 MED ORDER — WARFARIN VIDEO
Freq: Once | Status: AC
  Administered 2017-02-10: 10:00:00

## 2017-02-09 MED ORDER — ONDANSETRON HCL 4 MG/2ML IJ SOLN
INTRAMUSCULAR | Status: DC | PRN
  Administered 2017-02-09: 4 mg via INTRAVENOUS

## 2017-02-09 SURGICAL SUPPLY — 97 items
APPLIER CLIP 11 MED OPEN (CLIP) ×4
BANDAGE ACE 4X5 VEL STRL LF (GAUZE/BANDAGES/DRESSINGS) ×4 IMPLANT
BANDAGE ACE 6X5 VEL STRL LF (GAUZE/BANDAGES/DRESSINGS) ×4 IMPLANT
BIT DRILL 2.8 QUICK RELEASE (BIT) ×2 IMPLANT
BIT DRILL AO MATTA 2.5MX230M (BIT) ×2 IMPLANT
BLADE CLIPPER SURG (BLADE) IMPLANT
BLADE SURG 10 STRL SS (BLADE) ×12 IMPLANT
BNDG COHESIVE 4X5 TAN STRL (GAUZE/BANDAGES/DRESSINGS) ×4 IMPLANT
BRUSH SCRUB DISP (MISCELLANEOUS) ×8 IMPLANT
CLIP APPLIE 11 MED OPEN (CLIP) ×2 IMPLANT
DRAIN CHANNEL 15F RND FF W/TCR (WOUND CARE) IMPLANT
DRAPE C-ARM 42X72 X-RAY (DRAPES) ×8 IMPLANT
DRAPE C-ARMOR (DRAPES) ×4 IMPLANT
DRAPE EXTREMITY T 121X128X90 (DRAPE) ×4 IMPLANT
DRAPE HALF SHEET 40X57 (DRAPES) ×12 IMPLANT
DRAPE INCISE IOBAN 66X45 STRL (DRAPES) ×4 IMPLANT
DRAPE LAPAROTOMY TRNSV 102X78 (DRAPE) ×4 IMPLANT
DRAPE ORTHO SPLIT 77X108 STRL (DRAPES) ×2
DRAPE SURG 17X23 STRL (DRAPES) ×4 IMPLANT
DRAPE SURG ORHT 6 SPLT 77X108 (DRAPES) ×2 IMPLANT
DRAPE U-SHAPE 47X51 STRL (DRAPES) ×4 IMPLANT
DRILL 2.8 QUICK RELEASE (BIT) ×4
DRILL BIT AO MATTA 2.5MX230M (BIT) ×4
DRSG AQUACEL AG ADV 3.5X 4 (GAUZE/BANDAGES/DRESSINGS) ×8 IMPLANT
DRSG AQUACEL AG ADV 3.5X 6 (GAUZE/BANDAGES/DRESSINGS) ×8 IMPLANT
DRSG AQUACEL AG ADV 3.5X14 (GAUZE/BANDAGES/DRESSINGS) ×4 IMPLANT
DRSG MEPILEX BORDER 4X12 (GAUZE/BANDAGES/DRESSINGS) ×4 IMPLANT
ELECT BLADE 6.5 EXT (BLADE) ×4 IMPLANT
ELECT PENCIL ROCKER SW 15FT (MISCELLANEOUS) ×4 IMPLANT
ELECT REM PT RETURN 9FT ADLT (ELECTROSURGICAL) ×4
ELECTRODE REM PT RTRN 9FT ADLT (ELECTROSURGICAL) ×2 IMPLANT
EVACUATOR SILICONE 100CC (DRAIN) IMPLANT
GLOVE BIO SURGEON STRL SZ 6.5 (GLOVE) ×3 IMPLANT
GLOVE BIO SURGEON STRL SZ7.5 (GLOVE) ×8 IMPLANT
GLOVE BIO SURGEON STRL SZ8 (GLOVE) ×4 IMPLANT
GLOVE BIO SURGEONS STRL SZ 6.5 (GLOVE) ×1
GLOVE BIOGEL PI IND STRL 6.5 (GLOVE) ×2 IMPLANT
GLOVE BIOGEL PI IND STRL 7.5 (GLOVE) ×6 IMPLANT
GLOVE BIOGEL PI IND STRL 8 (GLOVE) ×4 IMPLANT
GLOVE BIOGEL PI INDICATOR 6.5 (GLOVE) ×2
GLOVE BIOGEL PI INDICATOR 7.5 (GLOVE) ×6
GLOVE BIOGEL PI INDICATOR 8 (GLOVE) ×4
GLOVE SS PI 9.0 STRL (GLOVE) ×4 IMPLANT
GOWN STRL REUS W/ TWL LRG LVL3 (GOWN DISPOSABLE) ×4 IMPLANT
GOWN STRL REUS W/ TWL XL LVL3 (GOWN DISPOSABLE) ×2 IMPLANT
GOWN STRL REUS W/TWL LRG LVL3 (GOWN DISPOSABLE) ×4
GOWN STRL REUS W/TWL XL LVL3 (GOWN DISPOSABLE) ×2
KIT BASIN OR (CUSTOM PROCEDURE TRAY) ×4 IMPLANT
KIT ROOM TURNOVER OR (KITS) ×4 IMPLANT
LIGHT WAVEGUIDE WIDE FLAT (MISCELLANEOUS) ×4 IMPLANT
MANIFOLD NEPTUNE II (INSTRUMENTS) ×4 IMPLANT
NS IRRIG 1000ML POUR BTL (IV SOLUTION) ×4 IMPLANT
PACK TOTAL JOINT (CUSTOM PROCEDURE TRAY) ×4 IMPLANT
PAD ARMBOARD 7.5X6 YLW CONV (MISCELLANEOUS) ×8 IMPLANT
PAD CAST 4YDX4 CTTN HI CHSV (CAST SUPPLIES) ×2 IMPLANT
PADDING CAST COTTON 4X4 STRL (CAST SUPPLIES) ×2
PLATE ACET STRT 70.5M 6H (Plate) ×4 IMPLANT
PLATE SYMPHYSIS 92.5M 6H (Plate) ×4 IMPLANT
PLATE ULNA MIDSHAFT 8 HOLE (Plate) ×4 IMPLANT
RETRACTOR YANK SUCT EIGR SABER (INSTRUMENTS) ×4 IMPLANT
SCREW CORTEX ST MATTA 3.5X22MM (Screw) ×4 IMPLANT
SCREW CORTEX ST MATTA 3.5X28MM (Screw) ×8 IMPLANT
SCREW CORTEX ST MATTA 3.5X30MM (Screw) ×4 IMPLANT
SCREW CORTEX ST MATTA 3.5X32MM (Screw) ×4 IMPLANT
SCREW CORTEX ST MATTA 3.5X34MM (Screw) ×4 IMPLANT
SCREW CORTEX ST MATTA 3.5X36MM (Screw) ×4 IMPLANT
SCREW CORTEX ST MATTA 3.5X38M (Screw) ×8 IMPLANT
SCREW CORTEX ST MATTA 3.5X40MM (Screw) ×4 IMPLANT
SCREW CORTEX ST MATTA 3.5X55MM (Screw) ×4 IMPLANT
SCREW CORTICAL 3.5X42MM (Screw) ×4 IMPLANT
SCREW HEXALOBE NON-LOCK 3.5X14 (Screw) ×4 IMPLANT
SCREW HEXALOBE NON-LOCK 3.5X16 (Screw) ×16 IMPLANT
SCREW NONLOCK HEX 3.5X12 (Screw) ×4 IMPLANT
SLING ARM IMMOBILIZER LRG (SOFTGOODS) ×4 IMPLANT
SPONGE LAP 18X18 X RAY DECT (DISPOSABLE) ×4 IMPLANT
STAPLER VISISTAT 35W (STAPLE) ×4 IMPLANT
STOCKINETTE 6  STRL (DRAPES) ×2
STOCKINETTE 6 STRL (DRAPES) ×2 IMPLANT
STOCKINETTE IMPERVIOUS 9X36 MD (GAUZE/BANDAGES/DRESSINGS) ×4 IMPLANT
SUCTION FRAZIER HANDLE 10FR (MISCELLANEOUS) ×2
SUCTION TUBE FRAZIER 10FR DISP (MISCELLANEOUS) ×2 IMPLANT
SUT ETHILON 3 0 PS 1 (SUTURE) ×16 IMPLANT
SUT VIC AB 0 CT1 27 (SUTURE) ×6
SUT VIC AB 0 CT1 27XBRD ANBCTR (SUTURE) ×6 IMPLANT
SUT VIC AB 1 CT1 18XCR BRD 8 (SUTURE) ×4 IMPLANT
SUT VIC AB 1 CT1 8-18 (SUTURE) ×4
SUT VIC AB 2-0 CT1 27 (SUTURE) ×4
SUT VIC AB 2-0 CT1 TAPERPNT 27 (SUTURE) ×4 IMPLANT
TOWEL OR 17X24 6PK STRL BLUE (TOWEL DISPOSABLE) ×4 IMPLANT
TOWEL OR 17X26 10 PK STRL BLUE (TOWEL DISPOSABLE) ×8 IMPLANT
TRAY FOLEY W/METER SILVER 16FR (SET/KITS/TRAYS/PACK) IMPLANT
TUBE CONNECTING 12'X1/4 (SUCTIONS) ×1
TUBE CONNECTING 12X1/4 (SUCTIONS) ×3 IMPLANT
TUBE CONNECTING 20'X1/4 (TUBING) ×1
TUBE CONNECTING 20X1/4 (TUBING) ×3 IMPLANT
WATER STERILE IRR 1000ML POUR (IV SOLUTION) IMPLANT
YANKAUER SUCT BULB TIP NO VENT (SUCTIONS) ×8 IMPLANT

## 2017-02-09 NOTE — Anesthesia Postprocedure Evaluation (Signed)
Anesthesia Post Note  Patient: Penny Long  Procedure(s) Performed: Procedure(s) (LRB): OPEN REDUCTION INTERNAL FIXATION (ORIF) PELVIC FRACTURE (N/A) OPEN REDUCTION INTERNAL FIXATION (ORIF) ELBOW/OLECRANON FRACTURE (Left)  Patient location during evaluation: PACU Anesthesia Type: General Level of consciousness: awake and alert Pain management: pain level controlled Vital Signs Assessment: post-procedure vital signs reviewed and stable Respiratory status: spontaneous breathing, nonlabored ventilation, respiratory function stable and patient connected to nasal cannula oxygen Cardiovascular status: blood pressure returned to baseline and stable Postop Assessment: no signs of nausea or vomiting Anesthetic complications: no       Last Vitals:  Vitals:   02/09/17 1835 02/09/17 1850  BP: (!) 151/93 (!) 142/85  Pulse: 99 92  Resp: (!) 21 (!) 22  Temp: 36.6 C     Last Pain:  Vitals:   02/09/17 1835  TempSrc:   PainSc: 7                  Kennieth Rad
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
## 2017-02-09 NOTE — Progress Notes (Signed)
Patient ID: Penny Long, female   DOB: 1986/10/03, 31 y.o.   MRN: [id number] Patient alert and oriented this morning she has no complaints. Pelvic external fixator in place. Plan for Dr. Carola Frost to follow-up for definitive internal fixation for the pelvic fractures and left ulna fracture with radial head dislocation

## 2017-02-09 NOTE — Anesthesia Preprocedure Evaluation (Signed)
Anesthesia Evaluation  Patient identified by MRN, date of birth, ID band Patient awake    Reviewed: Allergy & Precautions, NPO status , Patient's Chart, lab work & pertinent test results  Airway Mallampati: I  TM Distance: >3 FB Neck ROM: Full    Dental   Pulmonary neg pulmonary ROS,    Pulmonary exam normal breath sounds clear to auscultation       Cardiovascular negative cardio ROS Normal cardiovascular exam Rhythm:Regular Rate:Normal     Neuro/Psych Depression negative neurological ROS  negative psych ROS   GI/Hepatic negative GI ROS, Neg liver ROS,   Endo/Other  negative endocrine ROS  Renal/GU negative Renal ROS     Musculoskeletal negative musculoskeletal ROS (+)   Abdominal   Peds  Hematology negative hematology ROS (+)   Anesthesia Other Findings   Reproductive/Obstetrics negative OB ROS                                                             Anesthesia Evaluation  Patient identified by MRN, date of birth, ID band Patient awake    Reviewed: Allergy & Precautions, NPO status , Patient's Chart, lab work & pertinent test results  Airway Mallampati: I  TM Distance: >3 FB Neck ROM: Full    Dental   Pulmonary    Pulmonary exam normal        Cardiovascular Normal cardiovascular exam     Neuro/Psych Depression    GI/Hepatic   Endo/Other    Renal/GU      Musculoskeletal   Abdominal   Peds  Hematology   Anesthesia Other Findings   Reproductive/Obstetrics                             Anesthesia Physical Anesthesia Plan  ASA: II  Anesthesia Plan: General   Post-op Pain Management:    Induction: Intravenous  Airway Management Planned: Oral ETT  Additional Equipment:   Intra-op Plan:   Post-operative Plan: Extubation in OR  Informed Consent: I have reviewed the patients History and Physical, chart, labs and  discussed the procedure including the risks, benefits and alternatives for the proposed anesthesia with the patient or authorized representative who has indicated his/her understanding and acceptance.     Plan Discussed with: CRNA and Surgeon  Anesthesia Plan Comments:         Anesthesia Quick Evaluation  Anesthesia Physical Anesthesia Plan  ASA: II  Anesthesia Plan: General   Post-op Pain Management:    Induction: Intravenous  Airway Management Planned: Oral ETT  Additional Equipment:   Intra-op Plan:   Post-operative Plan: Extubation in OR  Informed Consent: I have reviewed the patients History and Physical, chart, labs and discussed the procedure including the risks, benefits and alternatives for the proposed anesthesia with the patient or authorized representative who has indicated his/her understanding and acceptance.   Dental advisory given  Plan Discussed with: CRNA  Anesthesia Plan Comments:        Anesthesia Quick Evaluation  
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
## 2017-02-09 NOTE — Transfer of Care (Signed)
Immediate Anesthesia Transfer of Care Note  Patient: Penny Long  Procedure(s) Performed: Procedure(s): OPEN REDUCTION INTERNAL FIXATION (ORIF) PELVIC FRACTURE (N/A) OPEN REDUCTION INTERNAL FIXATION (ORIF) ELBOW/OLECRANON FRACTURE (Left)  Patient Location: PACU  Anesthesia Type:General  Level of Consciousness: awake and alert   Airway & Oxygen Therapy: Patient Spontanous Breathing and Patient connected to nasal cannula oxygen  Post-op Assessment: Report given to RN and Post -op Vital signs reviewed and stable  Post vital signs: Reviewed and stable  Last Vitals:  Vitals:   02/09/17 0821 02/09/17 0900  BP:  92/80  Pulse:  80  Resp:  17  Temp: 36.8 C     Last Pain:  Vitals:   02/09/17 0821  TempSrc: Oral  PainSc:          Complications: No apparent anesthesia complications

## 2017-02-09 NOTE — Anesthesia Postprocedure Evaluation (Signed)
Anesthesia Post Note  Patient: Penny Long  Procedure(s) Performed: Procedure(s) (LRB): EXTERNAL FIXATION PELVIS (N/A)  Patient location during evaluation: PACU Anesthesia Type: General Level of consciousness: sedated and patient cooperative Pain management: pain level controlled Vital Signs Assessment: post-procedure vital signs reviewed and stable Respiratory status: spontaneous breathing Cardiovascular status: stable Anesthetic complications: no       Last Vitals:  Vitals:   02/09/17 0300 02/09/17 0400  BP: 109/64   Pulse: 84   Resp: 16   Temp:  37.2 C    Last Pain:  Vitals:   02/09/17 0416  TempSrc:   PainSc: 7                  Lewie Loron
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
## 2017-02-09 NOTE — Progress Notes (Signed)
PT Cancellation Note  Patient Details Name: Penny Long MRN: [id number] DOB: 1986-08-15   Cancelled Treatment:    Reason Eval/Treat Not Completed: Patient at procedure or test/unavailable.  Patient to OR today.  **MD:  Please write PT consult orders post-op including weight bearing status and activity level.  Thank you!!   Vena Austria 02/09/2017, 10:23 AM Durenda Hurt. Renaldo Fiddler, [facility] Acute Rehab Services Pager [phone]
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
## 2017-02-10 LAB — CBC
HCT: 25.4 % — ABNORMAL LOW (ref 36.0–46.0)
Hemoglobin: 8.6 g/dL — ABNORMAL LOW (ref 12.0–15.0)
MCH: 28.9 pg (ref 26.0–34.0)
MCHC: 33.9 g/dL (ref 30.0–36.0)
MCV: 85.2 fL (ref 78.0–100.0)
PLATELETS: 161 10*3/uL (ref 150–400)
RBC: 2.98 MIL/uL — AB (ref 3.87–5.11)
RDW: 14.3 % (ref 11.5–15.5)
WBC: 14.2 10*3/uL — ABNORMAL HIGH (ref 4.0–10.5)

## 2017-02-10 LAB — BASIC METABOLIC PANEL
Anion gap: 8 (ref 5–15)
BUN: 5 mg/dL — AB (ref 6–20)
CHLORIDE: 103 mmol/L (ref 101–111)
CO2: 27 mmol/L (ref 22–32)
CREATININE: 0.69 mg/dL (ref 0.44–1.00)
Calcium: 8.1 mg/dL — ABNORMAL LOW (ref 8.9–10.3)
Glucose, Bld: 125 mg/dL — ABNORMAL HIGH (ref 65–99)
Potassium: 3.1 mmol/L — ABNORMAL LOW (ref 3.5–5.1)
SODIUM: 138 mmol/L (ref 135–145)

## 2017-02-10 LAB — BPAM FFP
BLOOD PRODUCT EXPIRATION DATE: 201804252359
ISSUE DATE / TIME: 201804201339
Unit Type and Rh: 8400

## 2017-02-10 LAB — PREPARE FRESH FROZEN PLASMA: UNIT DIVISION: 0

## 2017-02-10 LAB — POCT I-STAT 4, (NA,K, GLUC, HGB,HCT)
Glucose, Bld: 103 mg/dL — ABNORMAL HIGH (ref 65–99)
HCT: 17 % — ABNORMAL LOW (ref 36.0–46.0)
Hemoglobin: 5.8 g/dL — CL (ref 12.0–15.0)
Potassium: 3.9 mmol/L (ref 3.5–5.1)
Sodium: 136 mmol/L (ref 135–145)

## 2017-02-10 LAB — PREGNANCY, URINE: PREG TEST UR: NEGATIVE

## 2017-02-10 LAB — PROTIME-INR
INR: 1.3
PROTHROMBIN TIME: 16.3 s — AB (ref 11.4–15.2)

## 2017-02-10 MED ORDER — SODIUM CHLORIDE 0.9 % IV SOLN
30.0000 meq | Freq: Once | INTRAVENOUS | Status: AC
  Administered 2017-02-10: 30 meq via INTRAVENOUS
  Filled 2017-02-10: qty 15

## 2017-02-10 MED ORDER — WARFARIN SODIUM 10 MG PO TABS
10.0000 mg | ORAL_TABLET | Freq: Once | ORAL | Status: AC
  Administered 2017-02-10: 10 mg via ORAL
  Filled 2017-02-10: qty 1

## 2017-02-10 NOTE — Progress Notes (Signed)
Inpatient Rehabilitation  Per PT request, patient was screened by Amarah Brossman for appropriateness for an Inpatient Acute Rehab consult.  At this time we are recommending an Inpatient Rehab consult.  Please order if you are agreeable.    Decie Verne, M.A., CCC/SLP Admission Coordinator  [facility]  Cell [phone]  

## 2017-02-10 NOTE — Progress Notes (Signed)
Subjective: 1 Day Post-Op Procedure(s) (LRB): OPEN REDUCTION INTERNAL FIXATION (ORIF) PELVIC FRACTURE (N/A) OPEN REDUCTION INTERNAL FIXATION (ORIF) ELBOW/OLECRANON FRACTURE (Left) Patient reports pain as mild and moderate.    Objective: Vital signs in last 24 hours: Temp:  [97.9 F (36.6 C)-99.3 F (37.4 C)] 98.2 F (36.8 C) (04/21 0800) Pulse Rate:  [73-99] 87 (04/21 0900) Resp:  [14-27] 16 (04/21 0900) BP: (119-186)/(57-102) 158/89 (04/21 0900) SpO2:  [94 %-100 %] 97 % (04/21 0900)  Intake/Output from previous day: 04/20 0701 - 04/21 0700 In: 5671.3 [P.O.:600; I.V.:3587.3; ZOXWR:604; IV Piggyback:600] Out: 5310 [Urine:4660; Blood:650] Intake/Output this shift: Total I/O In: 525 [P.O.:240; I.V.:20; IV Piggyback:265] Out: -    Recent Labs  02/08/17 1559 02/08/17 1612 02/09/17 0800 02/09/17 2155 02/10/17 0245  HGB 12.2 12.2 9.3* 9.1* 8.6*    Recent Labs  02/09/17 2155 02/10/17 0245  WBC 14.1* 14.2*  RBC 3.15* 2.98*  HCT 26.8* 25.4*  PLT 171 161    Recent Labs  02/09/17 0549 02/09/17 2155 02/10/17 0245  NA 138  --  138  K 3.5  --  3.1*  CL 104  --  103  CO2 19*  --  27  BUN 9  --  5*  CREATININE 0.87 0.72 0.69  GLUCOSE 105*  --  125*  CALCIUM 8.5*  --  8.1*    Recent Labs  02/08/17 1559 02/10/17 0245  INR 1.03 1.30    Neurovascular intact Sensation intact distally Intact pulses distally Dorsiflexion/Plantar flexion intact Incision: dressing C/D/I  Wiggles fingers and toes, good grip with left hand  Assessment/Plan: 1 Day Post-Op Procedure(s) (LRB): OPEN REDUCTION INTERNAL FIXATION (ORIF) PELVIC FRACTURE (N/A) OPEN REDUCTION INTERNAL FIXATION (ORIF) ELBOW/OLECRANON FRACTURE (Left)  Continue pain control as ordered NWB Bilat LE bed to chair transfers only (slide or lift) NWB LUE in splint dsg change prn Will cont to monitor acute blood loss anemia lovenox bridge to coumadin dvt proph Will cont to follow daily    Penny Long,  Penny Long 02/10/2017, 10:27 AM
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
## 2017-02-11 LAB — BASIC METABOLIC PANEL
ANION GAP: 9 (ref 5–15)
BUN: <5 mg/dL — ABNORMAL LOW (ref 6–20)
CALCIUM: 8.2 mg/dL — AB (ref 8.9–10.3)
CHLORIDE: 99 mmol/L — AB (ref 101–111)
CO2: 29 mmol/L (ref 22–32)
Creatinine, Ser: 0.66 mg/dL (ref 0.44–1.00)
GFR calc non Af Amer: >60 mL/min (ref >60)
Glucose, Bld: 122 mg/dL — ABNORMAL HIGH (ref 65–99)
Potassium: 2.9 mmol/L — ABNORMAL LOW (ref 3.5–5.1)
Sodium: 137 mmol/L (ref 135–145)

## 2017-02-11 LAB — CBC
HCT: 24.9 % — ABNORMAL LOW (ref 36.0–46.0)
Hemoglobin: 8.4 g/dL — ABNORMAL LOW (ref 12.0–15.0)
MCH: 28.9 pg (ref 26.0–34.0)
MCHC: 33.7 g/dL (ref 30.0–36.0)
MCV: 85.6 fL (ref 78.0–100.0)
Platelets: 165 10*3/uL (ref 150–400)
RBC: 2.91 MIL/uL — ABNORMAL LOW (ref 3.87–5.11)
RDW: 14 % (ref 11.5–15.5)
WBC: 14.4 10*3/uL — ABNORMAL HIGH (ref 4.0–10.5)

## 2017-02-11 LAB — PROTIME-INR
INR: 1.36
PROTHROMBIN TIME: 16.9 s — AB (ref 11.4–15.2)

## 2017-02-11 MED ORDER — SODIUM CHLORIDE 0.9 % IV SOLN
30.0000 meq | Freq: Once | INTRAVENOUS | Status: AC
  Administered 2017-02-11: 30 meq via INTRAVENOUS
  Filled 2017-02-11: qty 15

## 2017-02-11 MED ORDER — WARFARIN SODIUM 5 MG PO TABS
10.0000 mg | ORAL_TABLET | Freq: Once | ORAL | Status: AC
  Administered 2017-02-11: 10 mg via ORAL
  Filled 2017-02-11: qty 1

## 2017-02-11 MED ORDER — POTASSIUM CHLORIDE CRYS ER 20 MEQ PO TBCR
40.0000 meq | EXTENDED_RELEASE_TABLET | Freq: Two times a day (BID) | ORAL | Status: AC
Start: 2017-02-11 — End: 2017-02-11
  Administered 2017-02-11 (×2): 40 meq via ORAL
  Filled 2017-02-11 (×2): qty 2

## 2017-02-11 MED ORDER — SERTRALINE HCL 50 MG PO TABS
50.0000 mg | ORAL_TABLET | Freq: Every day | ORAL | Status: DC
  Administered 2017-02-11 – 2017-02-15 (×5): 50 mg via ORAL
  Filled 2017-02-11 (×5): qty 1

## 2017-02-11 MED ORDER — ALBUTEROL SULFATE (2.5 MG/3ML) 0.083% IN NEBU: 2.5000 mg | INHALATION_SOLUTION | RESPIRATORY_TRACT | Status: DC | PRN

## 2017-02-11 NOTE — Progress Notes (Signed)
Pt states her bed is uncomfortable. MD called and air bed is ordered. Pt is transferred to air bed and states she is much more comfortable. Will continue to monitor.

## 2017-02-11 NOTE — Progress Notes (Signed)
Subjective: 2 Days Post-Op Procedure(s) (LRB): OPEN REDUCTION INTERNAL FIXATION (ORIF) PELVIC FRACTURE (N/A) OPEN REDUCTION INTERNAL FIXATION (ORIF) ELBOW/OLECRANON FRACTURE (Left) Patient reports pain as mild and moderate.    Objective: Vital signs in last 24 hours: Temp:  [98.1 F (36.7 C)-98.9 F (37.2 C)] 98.6 F (37 C) (04/22 0349) Pulse Rate:  [68-115] 80 (04/22 0700) Resp:  [11-25] 17 (04/22 0700) BP: (114-148)/(49-88) 114/49 (04/22 0256) SpO2:  [84 %-100 %] 98 % (04/22 0700)  Intake/Output from previous day: 04/21 0701 - 04/22 0700 In: 985 [P.O.:480; I.V.:240; IV Piggyback:265] Out: 2325 [Urine:2325] Intake/Output this shift: Total I/O In: -  Out: 360 [Urine:360]   Recent Labs  02/09/17 0800 02/09/17 1308 02/09/17 2155 02/10/17 0245 02/11/17 0248  HGB 9.3* 5.8* 9.1* 8.6* 8.4*    Recent Labs  02/10/17 0245 02/11/17 0248  WBC 14.2* 14.4*  RBC 2.98* 2.91*  HCT 25.4* 24.9*  PLT 161 165    Recent Labs  02/10/17 0245 02/11/17 0248  NA 138 137  K 3.1* 2.9*  CL 103 99*  CO2 27 29  BUN 5* <5*  CREATININE 0.69 0.66  GLUCOSE 125* 122*  CALCIUM 8.1* 8.2*    Recent Labs  02/10/17 0245 02/11/17 0248  INR 1.30 1.36    Neurovascular intact Sensation intact distally Intact pulses distally Dorsiflexion/Plantar flexion intact Incision: dressing C/D/I  Assessment/Plan: 2 Days Post-Op Procedure(s) (LRB): OPEN REDUCTION INTERNAL FIXATION (ORIF) PELVIC FRACTURE (N/A) OPEN REDUCTION INTERNAL FIXATION (ORIF) ELBOW/OLECRANON FRACTURE (Left)  Continue pain control as ordered NWB Bilat LE bed to chair transfers only (slide or lift) NWB LUE in splint dsg change prn acute blood loss anemia stable today lovenox bridge to coumadin dvt proph Will cont to follow daily D/c planning consult for inpatient rehab per primary team  Margart Sickles 02/11/2017, 10:42 AM
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
## 2017-02-12 ENCOUNTER — Encounter (HOSPITAL_COMMUNITY): Payer: Self-pay | Admitting: Orthopedic Surgery

## 2017-02-12 DIAGNOSIS — S3282XD Multiple fractures of pelvis without disruption of pelvic ring, subsequent encounter for fracture with routine healing: Secondary | ICD-10-CM

## 2017-02-12 DIAGNOSIS — D72829 Elevated white blood cell count, unspecified: Secondary | ICD-10-CM

## 2017-02-12 DIAGNOSIS — S52272A Monteggia's fracture of left ulna, initial encounter for closed fracture: Secondary | ICD-10-CM

## 2017-02-12 DIAGNOSIS — S3282XA Multiple fractures of pelvis without disruption of pelvic ring, initial encounter for closed fracture: Secondary | ICD-10-CM | POA: Diagnosis present

## 2017-02-12 DIAGNOSIS — R791 Abnormal coagulation profile: Secondary | ICD-10-CM

## 2017-02-12 DIAGNOSIS — S32431A Displaced fracture of anterior column [iliopubic] of right acetabulum, initial encounter for closed fracture: Secondary | ICD-10-CM

## 2017-02-12 DIAGNOSIS — J45909 Unspecified asthma, uncomplicated: Secondary | ICD-10-CM

## 2017-02-12 DIAGNOSIS — S52272D Monteggia's fracture of left ulna, subsequent encounter for closed fracture with routine healing: Secondary | ICD-10-CM

## 2017-02-12 DIAGNOSIS — F329 Major depressive disorder, single episode, unspecified: Secondary | ICD-10-CM

## 2017-02-12 DIAGNOSIS — G8918 Other acute postprocedural pain: Secondary | ICD-10-CM

## 2017-02-12 DIAGNOSIS — E876 Hypokalemia: Secondary | ICD-10-CM

## 2017-02-12 DIAGNOSIS — F32A Depression, unspecified: Secondary | ICD-10-CM

## 2017-02-12 DIAGNOSIS — S332XXD Dislocation of sacroiliac and sacrococcygeal joint, subsequent encounter: Secondary | ICD-10-CM

## 2017-02-12 DIAGNOSIS — D62 Acute posthemorrhagic anemia: Secondary | ICD-10-CM

## 2017-02-12 HISTORY — DX: Monteggia's fracture of left ulna, initial encounter for closed fracture: S52.272A

## 2017-02-12 HISTORY — DX: Displaced fracture of anterior column (iliopubic) of right acetabulum, initial encounter for closed fracture: S32.431A

## 2017-02-12 HISTORY — DX: Multiple fractures of pelvis without disruption of pelvic ring, initial encounter for closed fracture: S32.82XA

## 2017-02-12 LAB — BPAM RBC
BLOOD PRODUCT EXPIRATION DATE: 201805112359
Blood Product Expiration Date: 201805112359
Blood Product Expiration Date: 201805182359
Blood Product Expiration Date: 201805182359
ISSUE DATE / TIME: 201804201316
ISSUE DATE / TIME: 201804201316
Unit Type and Rh: 7300
Unit Type and Rh: 7300
Unit Type and Rh: 8400
Unit Type and Rh: 8400

## 2017-02-12 LAB — TYPE AND SCREEN
ABO/RH(D): AB POS
Antibody Screen: NEGATIVE
UNIT DIVISION: 0
Unit division: 0
Unit division: 0
Unit division: 0

## 2017-02-12 LAB — POCT I-STAT 7, (LYTES, BLD GAS, ICA,H+H)
Acid-Base Excess: 3 mmol/L — ABNORMAL HIGH (ref 0.0–2.0)
Bicarbonate: 27.9 mmol/L (ref 20.0–28.0)
Calcium, Ion: 1.17 mmol/L (ref 1.15–1.40)
HEMATOCRIT: 26 % — AB (ref 36.0–46.0)
HEMOGLOBIN: 8.8 g/dL — AB (ref 12.0–15.0)
O2 Saturation: 98 %
PCO2 ART: 44.1 mmHg (ref 32.0–48.0)
POTASSIUM: 4 mmol/L (ref 3.5–5.1)
Sodium: 140 mmol/L (ref 135–145)
TCO2: 29 mmol/L (ref 0–100)
pH, Arterial: 7.412 (ref 7.350–7.450)
pO2, Arterial: 100 mmHg (ref 83.0–108.0)

## 2017-02-12 LAB — BASIC METABOLIC PANEL
ANION GAP: 5 (ref 5–15)
BUN: 6 mg/dL (ref 6–20)
CALCIUM: 8.3 mg/dL — AB (ref 8.9–10.3)
CHLORIDE: 102 mmol/L (ref 101–111)
CO2: 29 mmol/L (ref 22–32)
Creatinine, Ser: 0.64 mg/dL (ref 0.44–1.00)
GFR calc non Af Amer: >60 mL/min (ref >60)
GLUCOSE: 125 mg/dL — AB (ref 65–99)
POTASSIUM: 3.3 mmol/L — AB (ref 3.5–5.1)
Sodium: 136 mmol/L (ref 135–145)

## 2017-02-12 LAB — CBC
HEMATOCRIT: 26.9 % — AB (ref 36.0–46.0)
HEMOGLOBIN: 8.9 g/dL — AB (ref 12.0–15.0)
MCH: 28.4 pg (ref 26.0–34.0)
MCHC: 33.1 g/dL (ref 30.0–36.0)
MCV: 85.9 fL (ref 78.0–100.0)
Platelets: 197 10*3/uL (ref 150–400)
RBC: 3.13 MIL/uL — AB (ref 3.87–5.11)
RDW: 13.7 % (ref 11.5–15.5)
WBC: 13.3 10*3/uL — AB (ref 4.0–10.5)

## 2017-02-12 LAB — PROTIME-INR
INR: 4.89
PROTHROMBIN TIME: 47.9 s — AB (ref 11.4–15.2)
Prothrombin Time: 42.9 seconds — ABNORMAL HIGH (ref 11.4–15.2)

## 2017-02-12 MED ORDER — WARFARIN VIDEO
Freq: Once | Status: AC
  Administered 2017-02-12: 11:00:00

## 2017-02-12 MED ORDER — COUMADIN BOOK
Freq: Once | Status: AC
  Administered 2017-02-12: 11:00:00
  Filled 2017-02-12: qty 1
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
## 2017-02-12 NOTE — Progress Notes (Signed)
PT/ INR elevated. Pharmacy made aware. No bleeding noted at this time.

## 2017-02-12 NOTE — Progress Notes (Signed)
Pt's PT and INR has had a significant increase this am. Lab will redraw for clarification.
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
## 2017-02-13 LAB — BASIC METABOLIC PANEL
ANION GAP: 7 (ref 5–15)
BUN: 11 mg/dL (ref 6–20)
CALCIUM: 8.4 mg/dL — AB (ref 8.9–10.3)
CO2: 27 mmol/L (ref 22–32)
Chloride: 100 mmol/L — ABNORMAL LOW (ref 101–111)
Creatinine, Ser: 0.54 mg/dL (ref 0.44–1.00)
GFR calc non Af Amer: >60 mL/min (ref >60)
GLUCOSE: 108 mg/dL — AB (ref 65–99)
POTASSIUM: 3.6 mmol/L (ref 3.5–5.1)
Sodium: 134 mmol/L — ABNORMAL LOW (ref 135–145)

## 2017-02-13 LAB — CBC
HEMATOCRIT: 26.7 % — AB (ref 36.0–46.0)
HEMOGLOBIN: 8.9 g/dL — AB (ref 12.0–15.0)
MCH: 28.4 pg (ref 26.0–34.0)
MCHC: 33.3 g/dL (ref 30.0–36.0)
MCV: 85.3 fL (ref 78.0–100.0)
Platelets: 266 10*3/uL (ref 150–400)
RBC: 3.13 MIL/uL — AB (ref 3.87–5.11)
RDW: 13.7 % (ref 11.5–15.5)
WBC: 14.2 10*3/uL — AB (ref 4.0–10.5)

## 2017-02-13 LAB — PROTIME-INR
INR: 4.68 — AB
Prothrombin Time: 45.4 seconds — ABNORMAL HIGH (ref 11.4–15.2)
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
## 2017-02-13 NOTE — Discharge Instructions (Addendum)
[facility]opaedic [facility] Service Discharge Instructions,   General Discharge Instructions  WEIGHT BEARING STATUS: Nonweightbearing Bilateral lower extremities, ok to Weightbear through Left elbow to help with transfers  RANGE OF MOTION/ACTIVITY: unrestricted range of motion Bilateral lower extremities, gentle range of motion left elbow   Wound Care:   Daily wound care as needed    See below for detailed instructions    Discharge Wound Care Instructions  Do NOT apply any ointments, solutions or lotions to pin sites or surgical wounds.  These prevent needed drainage and even though solutions like hydrogen peroxide kill bacteria, they also damage cells lining the pin sites that help fight infection.  Applying lotions or ointments can keep the wounds moist and can cause them to breakdown and open up as well. This can increase the risk for infection. When in doubt call the office.  Surgical incisions should be dressed daily.  If any drainage is noted, use one layer of adaptic, then gauze, Kerlix, and an ace wrap.  Once the incision is completely dry and without drainage, it may be left open to air out.  Showering may begin 36-48 hours later.  Cleaning gently with soap and water.  Traumatic wounds should be dressed daily as well.    One layer of adaptic, gauze, Kerlix, then ace wrap.  The adaptic can be discontinued once the draining has ceased    If you have a wet to dry dressing: wet the gauze with saline the squeeze as much saline out so the gauze is moist (not soaking wet), place moistened gauze over wound, then place a dry gauze over the moist one, followed by Kerlix wrap, then ace wrap.    PAIN MEDICATION USE AND EXPECTATIONS  You have likely been given narcotic medications to help control your pain.  After a traumatic event that results in an fracture (broken bone) with or without surgery, it is ok to use narcotic pain medications to help control one's pain.  We understand that everyone  responds to pain differently and each individual patient will be evaluated on a regular basis for the continued need for narcotic medications. Ideally, narcotic medication use should last no more than 6-8 weeks (coinciding with fracture healing).   As a patient it is your responsibility as well to monitor narcotic medication use and report the amount and frequency you use these medications when you come to your office visit.   We would also advise that if you are using narcotic medications, you should take a dose prior to therapy to maximize you participation.  IF YOU ARE ON NARCOTIC MEDICATIONS IT IS NOT PERMISSIBLE TO OPERATE A MOTOR VEHICLE (MOTORCYCLE/CAR/TRUCK/MOPED) OR HEAVY MACHINERY DO NOT MIX NARCOTICS WITH OTHER CNS (CENTRAL NERVOUS SYSTEM) DEPRESSANTS SUCH AS ALCOHOL  Diet: as you were eating previously.  Can use over the counter stool softeners and bowel preparations, such as Miralax, to help with bowel movements.  Narcotics can be constipating.  Be sure to drink plenty of fluids    STOP SMOKING OR USING NICOTINE PRODUCTS!!!!  As discussed nicotine severely impairs your body's ability to heal surgical and traumatic wounds but also impairs bone healing.  Wounds and bone heal by forming microscopic blood vessels (angiogenesis) and nicotine is a vasoconstrictor (essentially, shrinks blood vessels).  Therefore, if vasoconstriction occurs to these microscopic blood vessels they essentially disappear and are unable to deliver necessary nutrients to the healing tissue.  This is one modifiable factor that you can do to dramatically increase your chances of healing  your injury.    (This means no smoking, no nicotine gum, patches, etc)  DO NOT USE NONSTEROIDAL ANTI-INFLAMMATORY DRUGS (NSAID'S)  Using products such as Advil (ibuprofen), Aleve (naproxen), Motrin (ibuprofen) for additional pain control during fracture healing can delay and/or prevent the healing response.  If you would like to take  over the counter (OTC) medication, Tylenol (acetaminophen) is ok.  However, some narcotic medications that are given for pain control contain acetaminophen as well. Therefore, you should not exceed more than 4000 mg of tylenol in a day if you do not have liver disease.  Also note that there are may OTC medicines, such as cold medicines and allergy medicines that my contain tylenol as well.  If you have any questions about medications and/or interactions please ask your doctor/PA or your pharmacist.      ICE AND ELEVATE INJURED/OPERATIVE EXTREMITY  Using ice and elevating the injured extremity above your heart can help with swelling and pain control.  Icing in a pulsatile fashion, such as 20 minutes on and 20 minutes off, can be followed.    Do not place ice directly on skin. Make sure there is a barrier between to skin and the ice pack.    Using frozen items such as frozen peas works well as the conform nicely to the are that needs to be iced.  USE AN ACE WRAP OR TED HOSE FOR SWELLING CONTROL  In addition to icing and elevation, Ace wraps or TED hose are used to help limit and resolve swelling.  It is recommended to use Ace wraps or TED hose until you are informed to stop.    When using Ace Wraps start the wrapping distally (farthest away from the body) and wrap proximally (closer to the body)   Example: If you had surgery on your leg or thing and you do not have a splint on, start the ace wrap at the toes and work your way up to the thigh        If you had surgery on your upper extremity and do not have a splint on, start the ace wrap at your fingers and work your way up to the upper arm  IF YOU ARE IN A SPLINT OR CAST DO NOT REMOVE IT FOR ANY REASON   If your splint gets wet for any reason please contact the office immediately. You may shower in your splint or cast as long as you keep it dry.  This can be done by wrapping in a cast cover or garbage back (or similar)  Do Not stick any thing down  your splint or cast such as pencils, money, or hangers to try and scratch yourself with.  If you feel itchy take benadryl as prescribed on the bottle for itching  IF YOU ARE IN A CAM BOOT (BLACK BOOT)  You may remove boot periodically. Perform daily dressing changes as noted below.  Wash the liner of the boot regularly and wear a sock when wearing the boot. It is recommended that you sleep in the boot until told otherwise  CALL THE OFFICE WITH ANY QUESTIONS OR CONCERNS: [phone]   Information on my medicine - Coumadin   (Warfarin)  Why was Coumadin prescribed for you? Coumadin was prescribed for you because you have a blood clot or a medical condition that can cause an increased risk of forming blood clots. Blood clots can cause serious health problems by blocking the flow of blood to the heart, lung, or brain. Coumadin  can prevent harmful blood clots from forming. As a reminder your indication for Coumadin is:   Blood Clot Prevention After Orthopedic Surgery  What test will check on my response to Coumadin? While on Coumadin (warfarin) you will need to have an INR test regularly to ensure that your dose is keeping you in the desired range. The INR (international normalized ratio) number is calculated from the result of the laboratory test called prothrombin time (PT).  If an INR APPOINTMENT HAS NOT ALREADY BEEN MADE FOR YOU please schedule an appointment to have this lab work done by your health care provider within 7 days. Your INR goal is usually a number between:  2 to 3 or your provider may give you a more narrow range like 2-2.5.  Ask your health care provider during an office visit what your goal INR is.  What  do you need to  know  About  COUMADIN? Take Coumadin (warfarin) exactly as prescribed by your healthcare provider about the same time each day.  DO NOT stop taking without talking to the doctor who prescribed the medication.  Stopping without other blood clot prevention  medication to take the place of Coumadin may increase your risk of developing a new clot or stroke.  Get refills before you run out.  What do you do if you miss a dose? If you miss a dose, take it as soon as you remember on the same day then continue your regularly scheduled regimen the next day.  Do not take two doses of Coumadin at the same time.  Important Safety Information A possible side effect of Coumadin (Warfarin) is an increased risk of bleeding. You should call your healthcare provider right away if you experience any of the following: ? Bleeding from an injury or your nose that does not stop. ? Unusual colored urine (red or dark brown) or unusual colored stools (red or black). ? Unusual bruising for unknown reasons. ? A serious fall or if you hit your head (even if there is no bleeding).  Some foods or medicines interact with Coumadin (warfarin) and might alter your response to warfarin. To help avoid this: ? Eat a balanced diet, maintaining a consistent amount of Vitamin K. ? Notify your provider about major diet changes you plan to make. ? Avoid alcohol or limit your intake to 1 drink for women and 2 drinks for men per day. (1 drink is 5 oz. wine, 12 oz. beer, or 1.5 oz. liquor.)  Make sure that ANY health care provider who prescribes medication for you knows that you are taking Coumadin (warfarin).  Also make sure the healthcare provider who is monitoring your Coumadin knows when you have started a new medication including herbals and non-prescription products.  Coumadin (Warfarin)  Major Drug Interactions  Increased Warfarin Effect Decreased Warfarin Effect  Alcohol (large quantities) Antibiotics (esp. Septra/Bactrim, Flagyl, Cipro) Amiodarone (Cordarone) Aspirin (ASA) Cimetidine (Tagamet) Megestrol (Megace) NSAIDs (ibuprofen, naproxen, etc.) Piroxicam (Feldene) Propafenone (Rythmol SR) Propranolol (Inderal) Isoniazid (INH) Posaconazole (Noxafil) Barbiturates  (Phenobarbital) Carbamazepine (Tegretol) Chlordiazepoxide (Librium) Cholestyramine (Questran) Griseofulvin Oral Contraceptives Rifampin Sucralfate (Carafate) Vitamin K   Coumadin (Warfarin) Major Herbal Interactions  Increased Warfarin Effect Decreased Warfarin Effect  Garlic Ginseng Ginkgo biloba Coenzyme Q10 Green tea St. Johns wort    Coumadin (Warfarin) FOOD Interactions  Eat a consistent number of servings per week of foods HIGH in Vitamin K (1 serving =  cup)  Collards (cooked, or boiled & drained) Kale (cooked, or boiled & drained)  Mustard greens (cooked, or boiled & drained) Parsley *serving size only =  cup Spinach (cooked, or boiled & drained) Swiss chard (cooked, or boiled & drained) Turnip greens (cooked, or boiled & drained)  Eat a consistent number of servings per week of foods MEDIUM-HIGH in Vitamin K (1 serving = 1 cup)  Asparagus (cooked, or boiled & drained) Broccoli (cooked, boiled & drained, or raw & chopped) Brussel sprouts (cooked, or boiled & drained) *serving size only =  cup Lettuce, raw (green leaf, endive, romaine) Spinach, raw Turnip greens, raw & chopped   These websites have more information on Coumadin (warfarin):  http://www.king-russell.com/; https://www.hines.net/;
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
## 2017-02-13 NOTE — Progress Notes (Signed)
I met with pt at bedside to discuss her limitations due to weight bearing status and her rehab, caregiver needs. She wants to d/c directly home. Her sister and nieces are arranging things at home such as a small temporary ramp for her one step entry and she feels her apartment is wheelchair accessible. She has 3 children 14, 47, and 33 years old which a niece is carrying  for. I have alerted RN CM and SW for DME needs and d/c planning. Pt does not want SNF at this time. We will sign off. [phone]
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
## 2017-02-13 NOTE — Progress Notes (Signed)
PT/INR elevated, Fayrene Fearing PharmD notified. No bleeding noted at this.

## 2017-02-14 LAB — CBC
HCT: 27.6 % — ABNORMAL LOW (ref 36.0–46.0)
Hemoglobin: 9.4 g/dL — ABNORMAL LOW (ref 12.0–15.0)
MCH: 29.6 pg (ref 26.0–34.0)
MCHC: 34.1 g/dL (ref 30.0–36.0)
MCV: 86.8 fL (ref 78.0–100.0)
PLATELETS: 270 10*3/uL (ref 150–400)
RBC: 3.18 MIL/uL — ABNORMAL LOW (ref 3.87–5.11)
RDW: 14 % (ref 11.5–15.5)
WBC: 11.6 10*3/uL — AB (ref 4.0–10.5)

## 2017-02-14 LAB — BASIC METABOLIC PANEL WITH GFR
Anion gap: 7 (ref 5–15)
BUN: 11 mg/dL (ref 6–20)
CO2: 30 mmol/L (ref 22–32)
Calcium: 8.6 mg/dL — ABNORMAL LOW (ref 8.9–10.3)
Chloride: 100 mmol/L — ABNORMAL LOW (ref 101–111)
Creatinine, Ser: 0.66 mg/dL (ref 0.44–1.00)
GFR calc Af Amer: >60 mL/min (ref >60)
GFR calc non Af Amer: >60 mL/min (ref >60)
Glucose, Bld: 102 mg/dL — ABNORMAL HIGH (ref 65–99)
Potassium: 3.7 mmol/L (ref 3.5–5.1)
Sodium: 137 mmol/L (ref 135–145)

## 2017-02-14 LAB — PROTIME-INR
INR: 2.79
Prothrombin Time: 30 s — ABNORMAL HIGH (ref 11.4–15.2)

## 2017-02-14 MED ORDER — WARFARIN SODIUM 5 MG PO TABS
2.5000 mg | ORAL_TABLET | Freq: Once | ORAL | Status: AC
  Administered 2017-02-14: 2.5 mg via ORAL
  Filled 2017-02-14: qty 1
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
## 2017-02-14 NOTE — Progress Notes (Signed)
[facility] checked benefits for [facility] PT/OT and it is not covered under her Medicaid Plan. DME was covered. Discussed with pt options Home VS SNF. Isidoro Donning RN CCM Case Mgmt phone [phone]
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
## 2017-02-15 LAB — PROTIME-INR
INR: 2.43
PROTHROMBIN TIME: 26.8 s — AB (ref 11.4–15.2)

## 2017-02-15 MED ORDER — ONDANSETRON HCL 4 MG PO TABS: 4.0000 mg | ORAL_TABLET | Freq: Four times a day (QID) | ORAL | 0 refills | Status: DC | PRN

## 2017-02-15 MED ORDER — OXYCODONE HCL 10 MG PO TABS: 10.0000 mg | ORAL_TABLET | ORAL | 0 refills | Status: DC | PRN

## 2017-02-15 MED ORDER — ACETAMINOPHEN 325 MG PO TABS: 650.0000 mg | ORAL_TABLET | Freq: Four times a day (QID) | ORAL | Status: DC | PRN

## 2017-02-15 MED ORDER — WARFARIN SODIUM 5 MG PO TABS
2.5000 mg | ORAL_TABLET | Freq: Once | ORAL | Status: AC
  Administered 2017-02-15: 2.5 mg via ORAL
  Filled 2017-02-15: qty 1

## 2017-02-15 MED ORDER — PANTOPRAZOLE SODIUM 40 MG PO TBEC: 40.0000 mg | DELAYED_RELEASE_TABLET | Freq: Every day | ORAL | 1 refills | Status: DC

## 2017-02-15 MED ORDER — METHOCARBAMOL 500 MG PO TABS: 1000.0000 mg | ORAL_TABLET | Freq: Four times a day (QID) | ORAL | 0 refills | Status: DC

## 2017-02-15 MED ORDER — WARFARIN SODIUM 2.5 MG PO TABS: 2.5000 mg | ORAL_TABLET | Freq: Once | ORAL | 0 refills | Status: DC

## 2017-02-15 MED ORDER — DOCUSATE SODIUM 100 MG PO CAPS: 100.0000 mg | ORAL_CAPSULE | Freq: Two times a day (BID) | ORAL | 1 refills | Status: DC
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
## 2017-02-15 NOTE — Progress Notes (Signed)
Pt discharged to home accompanied by PTAR. Discharge instructions and prescriptions  given to patient, with no questions asked.

## 2017-02-15 NOTE — Progress Notes (Signed)
Physical Therapy Treatment Patient Details Name: Penny Long MRN: [id number] DOB: [birth year] Today's Date: 02/15/2017    Equipment Recommendations Update to wheelchair dimensions as pt will fit in a smaller chair.  Also will need brake extenders.  Trying to contact PA to update regarding this change.    (18x16 wc,brake extenders,desk armrests, antitippers, elev LR)    Full treatment note to follow.         [facility] Acute Rehabilitation [phone] (pager)  Berline Lopes 02/15/2017, 11:47 AM
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
## 2017-02-16 NOTE — Progress Notes (Signed)
02/16/2017 1253 Received call from pt's and states Oxycodone is requiring a prior auth. Contacted [facility] and spoke to pharmacist, Alycia Rossetti. And Medicaid is requiring a prior auth for Oxycodone. Contacted [employer] and spoke to rep. States prior Berkley Harvey is not required for narcotics. And it takes 24 hours to process request which be 02/19/2017. NCM contacted pt and Oxycodone is $21 out of pocket with goodrx coupon. Faxed coupon to [facility]. She agreeable to getting medication and paying out of pocket. Will follow up with attending to complete Medicaid Prior Auth. Isidoro Donning RN CCM Case Mgmt phone [phone]

## 2017-03-01 ENCOUNTER — Other Ambulatory Visit (INDEPENDENT_AMBULATORY_CARE_PROVIDER_SITE_OTHER): Payer: Self-pay | Admitting: Orthopedic Surgery

## 2017-03-01 ENCOUNTER — Other Ambulatory Visit (INDEPENDENT_AMBULATORY_CARE_PROVIDER_SITE_OTHER): Payer: Medicaid Other

## 2017-03-01 DIAGNOSIS — M79669 Pain in unspecified lower leg: Secondary | ICD-10-CM

## 2017-03-01 DIAGNOSIS — M7989 Other specified soft tissue disorders: Secondary | ICD-10-CM

## 2017-05-15 ENCOUNTER — Ambulatory Visit: Payer: Medicaid Other | Attending: Orthopedic Surgery

## 2017-05-15 DIAGNOSIS — M79604 Pain in right leg: Secondary | ICD-10-CM | POA: Insufficient documentation

## 2017-05-15 DIAGNOSIS — R2689 Other abnormalities of gait and mobility: Secondary | ICD-10-CM | POA: Diagnosis present
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
## 2019-09-30 ENCOUNTER — Other Ambulatory Visit: Payer: Self-pay | Admitting: Orthopedic Surgery

## 2019-09-30 DIAGNOSIS — S32811D Multiple fractures of pelvis with unstable disruption of pelvic ring, subsequent encounter for fracture with routine healing: Secondary | ICD-10-CM

## 2019-10-10 ENCOUNTER — Other Ambulatory Visit: Payer: Self-pay

## 2019-10-10 ENCOUNTER — Ambulatory Visit
Admission: RE | Admit: 2019-10-10 | Discharge: 2019-10-10 | Disposition: A | Discharge status: Home / Self Care | Payer: Medicaid Other | Source: Ambulatory Visit | Attending: Orthopedic Surgery | Admitting: Orthopedic Surgery

## 2019-10-10 DIAGNOSIS — S32811D Multiple fractures of pelvis with unstable disruption of pelvic ring, subsequent encounter for fracture with routine healing: Secondary | ICD-10-CM | POA: Insufficient documentation

## 2020-05-17 ENCOUNTER — Other Ambulatory Visit
Admission: RE | Admit: 2020-05-17 | Discharge: 2020-05-17 | Disposition: A | Discharge status: Home / Self Care | Payer: Medicaid Other | Source: Ambulatory Visit | Attending: Orthopedic Surgery | Admitting: Orthopedic Surgery

## 2020-05-17 ENCOUNTER — Other Ambulatory Visit: Payer: Self-pay

## 2020-05-17 DIAGNOSIS — Z01812 Encounter for preprocedural laboratory examination: Secondary | ICD-10-CM | POA: Diagnosis present

## 2020-05-17 DIAGNOSIS — Z20822 Contact with and (suspected) exposure to covid-19: Secondary | ICD-10-CM | POA: Insufficient documentation

## 2020-05-17 LAB — SARS CORONAVIRUS 2 (TAT 6-24 HRS): SARS Coronavirus 2: NEGATIVE

## 2020-05-19 ENCOUNTER — Encounter (HOSPITAL_COMMUNITY): Payer: Self-pay | Admitting: Orthopedic Surgery

## 2020-05-19 NOTE — Progress Notes (Signed)
Patient denies shortness of breath, fever, cough or chest pain.  PCP - [facility] Cardiologist - n/a  Chest x-ray - n/a EKG - n/a Stress Test - n/a ECHO - n/a Cardiac Cath - n/a  STOP now taking any Aspirin (unless otherwise instructed by your surgeon), Aleve, Naproxen, Ibuprofen, Motrin, Advil, Goody's, BC's, all herbal medications, fish oil, and all vitamins.   Coronavirus Screening Covid test on 05/17/20 was negative.  Patient verbalized understanding of instructions that were given via phone.

## 2020-05-20 ENCOUNTER — Ambulatory Visit (HOSPITAL_COMMUNITY): Payer: Medicaid Other

## 2020-05-20 ENCOUNTER — Encounter (HOSPITAL_COMMUNITY)
Admission: RE | Disposition: A | Discharge status: Home / Self Care | Payer: Self-pay | Source: Home / Self Care | Attending: Orthopedic Surgery

## 2020-05-20 ENCOUNTER — Ambulatory Visit (HOSPITAL_COMMUNITY)
Admission: RE | Admit: 2020-05-20 | Discharge: 2020-05-21 | Disposition: A | Discharge status: Home / Self Care | Payer: Medicaid Other | Attending: Orthopedic Surgery | Admitting: Orthopedic Surgery

## 2020-05-20 ENCOUNTER — Other Ambulatory Visit: Payer: Self-pay

## 2020-05-20 ENCOUNTER — Encounter (HOSPITAL_COMMUNITY): Payer: Self-pay | Admitting: Orthopedic Surgery

## 2020-05-20 DIAGNOSIS — Z419 Encounter for procedure for purposes other than remedying health state, unspecified: Secondary | ICD-10-CM

## 2020-05-20 DIAGNOSIS — T84218A Breakdown (mechanical) of internal fixation device of other bones, initial encounter: Secondary | ICD-10-CM | POA: Diagnosis not present

## 2020-05-20 DIAGNOSIS — J45909 Unspecified asthma, uncomplicated: Secondary | ICD-10-CM | POA: Insufficient documentation

## 2020-05-20 DIAGNOSIS — T8484XA Pain due to internal orthopedic prosthetic devices, implants and grafts, initial encounter: Secondary | ICD-10-CM | POA: Diagnosis not present

## 2020-05-20 DIAGNOSIS — Z7951 Long term (current) use of inhaled steroids: Secondary | ICD-10-CM | POA: Insufficient documentation

## 2020-05-20 DIAGNOSIS — K219 Gastro-esophageal reflux disease without esophagitis: Secondary | ICD-10-CM | POA: Insufficient documentation

## 2020-05-20 DIAGNOSIS — Z881 Allergy status to other antibiotic agents status: Secondary | ICD-10-CM | POA: Insufficient documentation

## 2020-05-20 DIAGNOSIS — Z833 Family history of diabetes mellitus: Secondary | ICD-10-CM | POA: Diagnosis not present

## 2020-05-20 DIAGNOSIS — Z79899 Other long term (current) drug therapy: Secondary | ICD-10-CM | POA: Insufficient documentation

## 2020-05-20 DIAGNOSIS — Z823 Family history of stroke: Secondary | ICD-10-CM | POA: Diagnosis not present

## 2020-05-20 DIAGNOSIS — R519 Headache, unspecified: Secondary | ICD-10-CM | POA: Insufficient documentation

## 2020-05-20 DIAGNOSIS — F329 Major depressive disorder, single episode, unspecified: Secondary | ICD-10-CM | POA: Insufficient documentation

## 2020-05-20 DIAGNOSIS — Z7901 Long term (current) use of anticoagulants: Secondary | ICD-10-CM | POA: Diagnosis not present

## 2020-05-20 HISTORY — PX: HARDWARE REMOVAL: SHX979

## 2020-05-20 LAB — CBC WITH DIFFERENTIAL/PLATELET
Abs Immature Granulocytes: 0.05 10*3/uL (ref 0.00–0.07)
Basophils Absolute: 0.1 10*3/uL (ref 0.0–0.1)
Basophils Relative: 1 %
Eosinophils Absolute: 0.4 10*3/uL (ref 0.0–0.5)
Eosinophils Relative: 4 %
HCT: 36.1 % (ref 36.0–46.0)
Hemoglobin: 11.7 g/dL — ABNORMAL LOW (ref 12.0–15.0)
Immature Granulocytes: 1 %
Lymphocytes Relative: 26 %
Lymphs Abs: 2.7 10*3/uL (ref 0.7–4.0)
MCH: 28.7 pg (ref 26.0–34.0)
MCHC: 32.4 g/dL (ref 30.0–36.0)
MCV: 88.7 fL (ref 80.0–100.0)
Monocytes Absolute: 0.9 10*3/uL (ref 0.1–1.0)
Monocytes Relative: 8 %
Neutro Abs: 6.4 10*3/uL (ref 1.7–7.7)
Neutrophils Relative %: 60 %
Platelets: 327 10*3/uL (ref 150–400)
RBC: 4.07 MIL/uL (ref 3.87–5.11)
RDW: 13.1 % (ref 11.5–15.5)
WBC: 10.4 10*3/uL (ref 4.0–10.5)
nRBC: 0 % (ref 0.0–0.2)

## 2020-05-20 LAB — ABO/RH: ABO/RH(D): AB POS

## 2020-05-20 LAB — COMPREHENSIVE METABOLIC PANEL
ALT: 15 U/L (ref 0–44)
AST: 16 U/L (ref 15–41)
Albumin: 3.6 g/dL (ref 3.5–5.0)
Alkaline Phosphatase: 42 U/L (ref 38–126)
Anion gap: 9 (ref 5–15)
BUN: 12 mg/dL (ref 6–20)
CO2: 25 mmol/L (ref 22–32)
Calcium: 8.9 mg/dL (ref 8.9–10.3)
Chloride: 105 mmol/L (ref 98–111)
Creatinine, Ser: 0.95 mg/dL (ref 0.44–1.00)
GFR calc Af Amer: >60 mL/min (ref >60)
GFR calc non Af Amer: >60 mL/min (ref >60)
Glucose, Bld: 115 mg/dL — ABNORMAL HIGH (ref 70–99)
Potassium: 3.8 mmol/L (ref 3.5–5.1)
Sodium: 139 mmol/L (ref 135–145)
Total Bilirubin: 0.5 mg/dL (ref 0.3–1.2)
Total Protein: 6.7 g/dL (ref 6.5–8.1)

## 2020-05-20 LAB — PROTIME-INR
INR: 1 (ref 0.8–1.2)
Prothrombin Time: 12.8 seconds (ref 11.4–15.2)

## 2020-05-20 LAB — APTT: aPTT: 33 seconds (ref 24–36)

## 2020-05-20 LAB — TYPE AND SCREEN
ABO/RH(D): AB POS
Antibody Screen: NEGATIVE

## 2020-05-20 LAB — POCT PREGNANCY, URINE: Preg Test, Ur: NEGATIVE

## 2020-05-20 SURGERY — REMOVAL, HARDWARE
Anesthesia: General | Laterality: Right

## 2020-05-20 MED ORDER — PHENYLEPHRINE 40 MCG/ML (10ML) SYRINGE FOR IV PUSH (FOR BLOOD PRESSURE SUPPORT)
PREFILLED_SYRINGE | INTRAVENOUS | Status: AC
  Filled 2020-05-20: qty 10

## 2020-05-20 MED ORDER — PHENYLEPHRINE HCL-NACL 10-0.9 MG/250ML-% IV SOLN
INTRAVENOUS | Status: DC | PRN
  Administered 2020-05-20: 10 ug/min via INTRAVENOUS

## 2020-05-20 MED ORDER — ENOXAPARIN SODIUM 40 MG/0.4ML ~~LOC~~ SOLN
40.0000 mg | SUBCUTANEOUS | Status: DC
  Administered 2020-05-21: 40 mg via SUBCUTANEOUS
  Filled 2020-05-20: qty 0.4

## 2020-05-20 MED ORDER — KETAMINE HCL 50 MG/5ML IJ SOSY
PREFILLED_SYRINGE | INTRAMUSCULAR | Status: AC
  Filled 2020-05-20: qty 5

## 2020-05-20 MED ORDER — OXYCODONE HCL 5 MG/5ML PO SOLN: 5.0000 mg | Freq: Once | ORAL | Status: AC | PRN

## 2020-05-20 MED ORDER — ACETAMINOPHEN 500 MG PO TABS
1000.0000 mg | ORAL_TABLET | Freq: Once | ORAL | Status: AC
  Administered 2020-05-20: 1000 mg via ORAL
  Filled 2020-05-20: qty 2

## 2020-05-20 MED ORDER — TRAMADOL HCL 50 MG PO TABS: 50.0000 mg | ORAL_TABLET | Freq: Four times a day (QID) | ORAL | Status: DC | PRN

## 2020-05-20 MED ORDER — GABAPENTIN 300 MG PO CAPS
300.0000 mg | ORAL_CAPSULE | Freq: Once | ORAL | Status: AC
  Administered 2020-05-20: 300 mg via ORAL
  Filled 2020-05-20: qty 1

## 2020-05-20 MED ORDER — ONDANSETRON HCL 4 MG/2ML IJ SOLN
INTRAMUSCULAR | Status: AC
  Filled 2020-05-20: qty 2

## 2020-05-20 MED ORDER — DEXAMETHASONE SODIUM PHOSPHATE 10 MG/ML IJ SOLN
INTRAMUSCULAR | Status: AC
  Filled 2020-05-20: qty 1

## 2020-05-20 MED ORDER — ROCURONIUM BROMIDE 10 MG/ML (PF) SYRINGE
PREFILLED_SYRINGE | INTRAVENOUS | Status: AC
  Filled 2020-05-20: qty 10

## 2020-05-20 MED ORDER — HYDROMORPHONE HCL 1 MG/ML IJ SOLN
INTRAMUSCULAR | Status: AC
  Filled 2020-05-20: qty 0.5

## 2020-05-20 MED ORDER — ALBUTEROL SULFATE (2.5 MG/3ML) 0.083% IN NEBU: 2.5000 mg | INHALATION_SOLUTION | RESPIRATORY_TRACT | Status: DC | PRN

## 2020-05-20 MED ORDER — ACETAMINOPHEN 500 MG PO TABS
500.0000 mg | ORAL_TABLET | Freq: Two times a day (BID) | ORAL | Status: DC
  Administered 2020-05-20: 500 mg via ORAL
  Filled 2020-05-20: qty 1

## 2020-05-20 MED ORDER — PROPOFOL 10 MG/ML IV BOLUS
INTRAVENOUS | Status: DC | PRN
  Administered 2020-05-20: 160 mg via INTRAVENOUS

## 2020-05-20 MED ORDER — ONDANSETRON HCL 4 MG/2ML IJ SOLN: 4.0000 mg | Freq: Four times a day (QID) | INTRAMUSCULAR | Status: DC | PRN

## 2020-05-20 MED ORDER — HYDROCODONE-ACETAMINOPHEN 5-325 MG PO TABS
1.0000 | ORAL_TABLET | Freq: Four times a day (QID) | ORAL | Status: DC | PRN
  Administered 2020-05-20 – 2020-05-21 (×2): 2 via ORAL
  Filled 2020-05-20 (×2): qty 2

## 2020-05-20 MED ORDER — CHLORHEXIDINE GLUCONATE 0.12 % MT SOLN
15.0000 mL | Freq: Once | OROMUCOSAL | Status: AC
  Administered 2020-05-20: 15 mL via OROMUCOSAL
  Filled 2020-05-20: qty 15

## 2020-05-20 MED ORDER — POLYETHYLENE GLYCOL 3350 17 G PO PACK
17.0000 g | PACK | Freq: Every day | ORAL | Status: DC
  Administered 2020-05-20: 17 g via ORAL
  Filled 2020-05-20 (×2): qty 1

## 2020-05-20 MED ORDER — VANCOMYCIN HCL 1000 MG IV SOLR
INTRAVENOUS | Status: DC | PRN
  Administered 2020-05-20: 1000 mg via TOPICAL

## 2020-05-20 MED ORDER — HYDROMORPHONE HCL 1 MG/ML IJ SOLN
INTRAMUSCULAR | Status: DC | PRN
  Administered 2020-05-20: .5 mg via INTRAVENOUS

## 2020-05-20 MED ORDER — VANCOMYCIN HCL 1000 MG IV SOLR
INTRAVENOUS | Status: AC
  Filled 2020-05-20: qty 1000

## 2020-05-20 MED ORDER — ALBUTEROL SULFATE HFA 108 (90 BASE) MCG/ACT IN AERS: 3.0000 | INHALATION_SPRAY | RESPIRATORY_TRACT | Status: DC | PRN

## 2020-05-20 MED ORDER — MIDAZOLAM HCL 2 MG/2ML IJ SOLN
INTRAMUSCULAR | Status: AC
  Filled 2020-05-20: qty 2

## 2020-05-20 MED ORDER — SUGAMMADEX SODIUM 500 MG/5ML IV SOLN
INTRAVENOUS | Status: DC | PRN
Start: 2020-05-20 — End: 2020-05-20
  Administered 2020-05-20: 200 mg via INTRAVENOUS
  Administered 2020-05-20: 50 mg via INTRAVENOUS

## 2020-05-20 MED ORDER — KETOROLAC TROMETHAMINE 15 MG/ML IJ SOLN
INTRAMUSCULAR | Status: AC
  Administered 2020-05-20: 15 mg
  Filled 2020-05-20: qty 1

## 2020-05-20 MED ORDER — METHOCARBAMOL 750 MG PO TABS
750.0000 mg | ORAL_TABLET | Freq: Four times a day (QID) | ORAL | Status: DC | PRN
  Filled 2020-05-20: qty 1

## 2020-05-20 MED ORDER — ONDANSETRON HCL 4 MG/2ML IJ SOLN
INTRAMUSCULAR | Status: DC | PRN
  Administered 2020-05-20: 4 mg via INTRAVENOUS

## 2020-05-20 MED ORDER — CEFAZOLIN SODIUM-DEXTROSE 2-4 GM/100ML-% IV SOLN
2.0000 g | INTRAVENOUS | Status: AC
  Administered 2020-05-20: 2 g via INTRAVENOUS
  Filled 2020-05-20: qty 100

## 2020-05-20 MED ORDER — ONDANSETRON HCL 4 MG PO TABS: 4.0000 mg | ORAL_TABLET | Freq: Four times a day (QID) | ORAL | Status: DC | PRN

## 2020-05-20 MED ORDER — DEXAMETHASONE SODIUM PHOSPHATE 10 MG/ML IJ SOLN
INTRAMUSCULAR | Status: DC | PRN
  Administered 2020-05-20: 10 mg via INTRAVENOUS

## 2020-05-20 MED ORDER — DOCUSATE SODIUM 100 MG PO CAPS
100.0000 mg | ORAL_CAPSULE | Freq: Two times a day (BID) | ORAL | Status: DC
  Administered 2020-05-21: 100 mg via ORAL
  Filled 2020-05-20 (×2): qty 1

## 2020-05-20 MED ORDER — LIDOCAINE 2% (20 MG/ML) 5 ML SYRINGE
INTRAMUSCULAR | Status: AC
  Filled 2020-05-20: qty 5

## 2020-05-20 MED ORDER — FENTANYL CITRATE (PF) 100 MCG/2ML IJ SOLN
25.0000 ug | INTRAMUSCULAR | Status: DC | PRN
  Administered 2020-05-20: 50 ug via INTRAVENOUS

## 2020-05-20 MED ORDER — METOCLOPRAMIDE HCL 5 MG PO TABS: 5.0000 mg | ORAL_TABLET | Freq: Three times a day (TID) | ORAL | Status: DC | PRN

## 2020-05-20 MED ORDER — 0.9 % SODIUM CHLORIDE (POUR BTL) OPTIME
TOPICAL | Status: DC | PRN
  Administered 2020-05-20: 1000 mL

## 2020-05-20 MED ORDER — OXYCODONE HCL 5 MG PO TABS
5.0000 mg | ORAL_TABLET | Freq: Once | ORAL | Status: AC | PRN
  Administered 2020-05-20: 5 mg via ORAL

## 2020-05-20 MED ORDER — ACETAMINOPHEN 325 MG PO TABS: 325.0000 mg | ORAL_TABLET | Freq: Four times a day (QID) | ORAL | Status: DC | PRN

## 2020-05-20 MED ORDER — METHOCARBAMOL 500 MG PO TABS
ORAL_TABLET | ORAL | Status: AC
  Filled 2020-05-20: qty 1

## 2020-05-20 MED ORDER — FENTANYL CITRATE (PF) 100 MCG/2ML IJ SOLN
INTRAMUSCULAR | Status: DC | PRN
  Administered 2020-05-20: 25 ug via INTRAVENOUS
  Administered 2020-05-20 (×2): 50 ug via INTRAVENOUS
  Administered 2020-05-20: 100 ug via INTRAVENOUS
  Administered 2020-05-20: 25 ug via INTRAVENOUS

## 2020-05-20 MED ORDER — METOCLOPRAMIDE HCL 5 MG/ML IJ SOLN: 5.0000 mg | Freq: Three times a day (TID) | INTRAMUSCULAR | Status: DC | PRN

## 2020-05-20 MED ORDER — ORAL CARE MOUTH RINSE: 15.0000 mL | Freq: Once | OROMUCOSAL | Status: AC

## 2020-05-20 MED ORDER — ROCURONIUM BROMIDE 10 MG/ML (PF) SYRINGE
PREFILLED_SYRINGE | INTRAVENOUS | Status: DC | PRN
  Administered 2020-05-20: 50 mg via INTRAVENOUS
  Administered 2020-05-20 (×2): 20 mg via INTRAVENOUS

## 2020-05-20 MED ORDER — CEFAZOLIN SODIUM-DEXTROSE 1-4 GM/50ML-% IV SOLN
1.0000 g | Freq: Four times a day (QID) | INTRAVENOUS | Status: AC
  Administered 2020-05-20 – 2020-05-21 (×3): 1 g via INTRAVENOUS
  Filled 2020-05-20 (×3): qty 50

## 2020-05-20 MED ORDER — PROPOFOL 10 MG/ML IV BOLUS
INTRAVENOUS | Status: AC
  Filled 2020-05-20: qty 40

## 2020-05-20 MED ORDER — CELECOXIB 200 MG PO CAPS
400.0000 mg | ORAL_CAPSULE | Freq: Once | ORAL | Status: AC
  Administered 2020-05-20: 400 mg via ORAL
  Filled 2020-05-20: qty 2

## 2020-05-20 MED ORDER — FENTANYL CITRATE (PF) 100 MCG/2ML IJ SOLN
INTRAMUSCULAR | Status: AC
  Administered 2020-05-20: 50 ug via INTRAVENOUS
  Filled 2020-05-20: qty 2

## 2020-05-20 MED ORDER — LIDOCAINE 2% (20 MG/ML) 5 ML SYRINGE
INTRAMUSCULAR | Status: DC | PRN
  Administered 2020-05-20: 50 mg via INTRAVENOUS

## 2020-05-20 MED ORDER — MIDAZOLAM HCL 5 MG/5ML IJ SOLN
INTRAMUSCULAR | Status: DC | PRN
  Administered 2020-05-20: 2 mg via INTRAVENOUS

## 2020-05-20 MED ORDER — METHOCARBAMOL 1000 MG/10ML IJ SOLN
500.0000 mg | Freq: Four times a day (QID) | INTRAVENOUS | Status: DC | PRN
  Filled 2020-05-20: qty 5

## 2020-05-20 MED ORDER — SUCCINYLCHOLINE CHLORIDE 200 MG/10ML IV SOSY
PREFILLED_SYRINGE | INTRAVENOUS | Status: AC
  Filled 2020-05-20: qty 10

## 2020-05-20 MED ORDER — MORPHINE SULFATE (PF) 2 MG/ML IV SOLN: 0.5000 mg | INTRAVENOUS | Status: DC | PRN

## 2020-05-20 MED ORDER — OXYCODONE HCL 5 MG PO TABS
ORAL_TABLET | ORAL | Status: AC
  Filled 2020-05-20: qty 1

## 2020-05-20 MED ORDER — LACTATED RINGERS IV SOLN: INTRAVENOUS | Status: DC

## 2020-05-20 MED ORDER — KETOROLAC TROMETHAMINE 15 MG/ML IJ SOLN
15.0000 mg | Freq: Four times a day (QID) | INTRAMUSCULAR | Status: DC
  Administered 2020-05-20 – 2020-05-21 (×5): 15 mg via INTRAVENOUS
  Filled 2020-05-20 (×4): qty 1

## 2020-05-20 MED ORDER — FENTANYL CITRATE (PF) 250 MCG/5ML IJ SOLN
INTRAMUSCULAR | Status: AC
  Filled 2020-05-20: qty 5

## 2020-05-20 SURGICAL SUPPLY — 53 items
BNDG GAUZE ELAST 4 BULKY (GAUZE/BANDAGES/DRESSINGS) ×6 IMPLANT
BRUSH SCRUB EZ PLAIN DRY (MISCELLANEOUS) ×6 IMPLANT
COVER SURGICAL LIGHT HANDLE (MISCELLANEOUS) ×6 IMPLANT
COVER WAND RF STERILE (DRAPES) ×3 IMPLANT
CUFF TOURN SGL QUICK 18X4 (TOURNIQUET CUFF) IMPLANT
CUFF TOURN SGL QUICK 24 (TOURNIQUET CUFF)
CUFF TRNQT CYL 24X4X16.5-23 (TOURNIQUET CUFF) IMPLANT
DRAPE C-ARM 42X72 X-RAY (DRAPES) IMPLANT
DRAPE C-ARMOR (DRAPES) ×3 IMPLANT
DRAPE ORTHO SPLIT 77X108 STRL (DRAPES) ×4
DRAPE SURG ORHT 6 SPLT 77X108 (DRAPES) ×2 IMPLANT
DRAPE U-SHAPE 47X51 STRL (DRAPES) ×3 IMPLANT
DRSG ADAPTIC 3X8 NADH LF (GAUZE/BANDAGES/DRESSINGS) ×3 IMPLANT
DRSG MEPILEX BORDER 4X4 (GAUZE/BANDAGES/DRESSINGS) ×3 IMPLANT
DRSG MEPILEX BORDER 4X8 (GAUZE/BANDAGES/DRESSINGS) ×3 IMPLANT
ELECT BLADE 6.5 EXT (BLADE) ×3 IMPLANT
ELECT REM PT RETURN 9FT ADLT (ELECTROSURGICAL) ×3
ELECTRODE REM PT RTRN 9FT ADLT (ELECTROSURGICAL) ×1 IMPLANT
EXTRACTOR STRIPPED SCREW 6 (INSTRUMENTS) ×3 IMPLANT
EXTRACTOR STRIPPED SCREW 7 (INSTRUMENTS) ×3 IMPLANT
GAUZE SPONGE 4X4 12PLY STRL (GAUZE/BANDAGES/DRESSINGS) ×3 IMPLANT
GLOVE BIO SURGEON STRL SZ7.5 (GLOVE) ×3 IMPLANT
GLOVE BIO SURGEON STRL SZ8 (GLOVE) ×3 IMPLANT
GLOVE BIOGEL PI IND STRL 7.5 (GLOVE) ×1 IMPLANT
GLOVE BIOGEL PI IND STRL 8 (GLOVE) ×1 IMPLANT
GLOVE BIOGEL PI INDICATOR 7.5 (GLOVE) ×2
GLOVE BIOGEL PI INDICATOR 8 (GLOVE) ×2
GOWN STRL REUS W/ TWL LRG LVL3 (GOWN DISPOSABLE) ×2 IMPLANT
GOWN STRL REUS W/ TWL XL LVL3 (GOWN DISPOSABLE) ×1 IMPLANT
GOWN STRL REUS W/TWL LRG LVL3 (GOWN DISPOSABLE) ×4
GOWN STRL REUS W/TWL XL LVL3 (GOWN DISPOSABLE) ×2
KIT BASIN OR (CUSTOM PROCEDURE TRAY) ×3 IMPLANT
KIT TURNOVER KIT B (KITS) ×3 IMPLANT
NS IRRIG 1000ML POUR BTL (IV SOLUTION) ×3 IMPLANT
PACK GENERAL/GYN (CUSTOM PROCEDURE TRAY) ×3 IMPLANT
PACK UNIVERSAL I (CUSTOM PROCEDURE TRAY) ×3 IMPLANT
PAD ARMBOARD 7.5X6 YLW CONV (MISCELLANEOUS) ×6 IMPLANT
SPONGE LAP 18X18 RF (DISPOSABLE) ×3 IMPLANT
STAPLER VISISTAT 35W (STAPLE) ×3 IMPLANT
SUCTION FRAZIER HANDLE 10FR (MISCELLANEOUS)
SUCTION TUBE FRAZIER 10FR DISP (MISCELLANEOUS) IMPLANT
SUT ETHILON 3 0 PS 1 (SUTURE) IMPLANT
SUT VIC AB 0 CT1 27 (SUTURE)
SUT VIC AB 0 CT1 27XBRD ANBCTR (SUTURE) IMPLANT
SUT VIC AB 1 CTX 18 (SUTURE) ×3 IMPLANT
SUT VIC AB 2-0 CT1 27 (SUTURE)
SUT VIC AB 2-0 CT1 TAPERPNT 27 (SUTURE) IMPLANT
SYR CONTROL 10ML LL (SYRINGE) IMPLANT
TOWEL GREEN STERILE (TOWEL DISPOSABLE) ×6 IMPLANT
TOWEL GREEN STERILE FF (TOWEL DISPOSABLE) ×6 IMPLANT
TUBE CONNECTING 12'X1/4 (SUCTIONS) ×1
TUBE CONNECTING 12X1/4 (SUCTIONS) ×2 IMPLANT
WATER STERILE IRR 1000ML POUR (IV SOLUTION) ×6 IMPLANT

## 2020-05-20 NOTE — H&P (Signed)
[facility] H&P/Consult     Patient ID: Penny Long MRN: [id number] DOB/AGE: 36-May-1987 36 y.o.  Chief Complaint:  Symptomatic hardware HPI: Penny Long is an 34 y.o. female.s/p ORIF pelvis with SI screw fixation who has had persistent pain that has not resolved with conservative measures.  Past Medical History:  Diagnosis Date  . Asthma   . Closed dislocation of sacroiliac joint, right  02/08/2017  . Closed fracture, Monteggia, left 02/12/2017  . Depression   . Displaced fracture of anterior column (iliopubic) of right acetabulum, initial encounter for closed fracture (HCC) 02/12/2017  . GERD (gastroesophageal reflux disease)    no meds  . Headache   . Multiple closed anterior-posterior compression fractures of pelvis (HCC) 02/12/2017    Past Surgical History:  Procedure Laterality Date  . CESAREAN SECTION     x3  . EXTERNAL FIXATION PELVIS N/A 02/08/2017   Procedure: EXTERNAL FIXATION PELVIS;  Surgeon: [name], MD;  [facility]: [facility] OR;  Service: Orthopedics;  Laterality: N/A;  . ORIF ELBOW FRACTURE Left 02/09/2017   Procedure: OPEN REDUCTION INTERNAL FIXATION (ORIF) ELBOW/OLECRANON FRACTURE;  Surgeon: Myrene Galas, MD;  Location: [facility] OR;  Service: Orthopedics;  Laterality: Left;  . ORIF PELVIC FRACTURE N/A 02/09/2017   Procedure: OPEN REDUCTION INTERNAL FIXATION (ORIF) PELVIC FRACTURE;  Surgeon: Myrene Galas, MD;  Location: [facility] OR;  Service: Orthopedics;  Laterality: N/A;  . TONSILLECTOMY    . TUBAL LIGATION    . WISDOM TOOTH EXTRACTION      Family History  Problem Relation Age of Onset  . Stroke Mother   . Diabetes Mother    Social History:  reports that she has never smoked. She has never used smokeless tobacco. She reports current alcohol use. She reports current drug use. Drug: Marijuana.  Allergies:  Allergies  Allergen Reactions  . Erythromycin Other (See Comments)    Unknown    Medications Prior to Admission  Medication  Sig Dispense Refill  . albuterol (PROVENTIL HFA;VENTOLIN HFA) 108 (90 Base) MCG/ACT inhaler Inhale 3 puffs into the lungs every 4 (four) hours as needed for wheezing or shortness of breath.     . clotrimazole (LOTRIMIN) 1 % cream Apply 1 application topically 2 (two) times daily.    Marland Kitchen triamcinolone ointment (KENALOG) 0.1 % Apply 1 application topically daily as needed (rash).    . methocarbamol (ROBAXIN) 500 MG tablet Take 2 tablets (1,000 mg total) by mouth 4 (four) times daily. (Patient not taking: Reported on 05/17/2020) 60 tablet 0  . pantoprazole (PROTONIX) 40 MG tablet Take 1 tablet (40 mg total) by mouth daily. (Patient not taking: Reported on 05/17/2020) 30 tablet 1  . warfarin (COUMADIN) 2.5 MG tablet Take 1 tablet (2.5 mg total) by mouth one time only at 6 PM. (Patient not taking: Reported on 05/17/2020) 30 tablet 0    Results for orders placed or performed during the hospital encounter of 05/20/20 (from the past 48 hour(s))  CBC WITH DIFFERENTIAL     Status: Abnormal   Collection Time: 05/20/20  6:08 AM  Result Value Ref Range   WBC 10.4 4.0 - 10.5 K/uL   RBC 4.07 3.87 - 5.11 MIL/uL   Hemoglobin 11.7 (L) 12.0 - 15.0 g/dL   HCT 54.6 36 - 46 %   MCV 88.7 80.0 - 100.0 fL   MCH 28.7 26.0 - 34.0 pg   MCHC 32.4 30.0 - 36.0 g/dL   RDW 56.8 12.7 - 51.7 %  Platelets 327 150 - 400 K/uL   nRBC 0.0 0.0 - 0.2 %   Neutrophils Relative % 60 %   Neutro Abs 6.4 1.7 - 7.7 K/uL   Lymphocytes Relative 26 %   Lymphs Abs 2.7 0.7 - 4.0 K/uL   Monocytes Relative 8 %   Monocytes Absolute 0.9 0 - 1 K/uL   Eosinophils Relative 4 %   Eosinophils Absolute 0.4 0 - 0 K/uL   Basophils Relative 1 %   Basophils Absolute 0.1 0 - 0 K/uL   Immature Granulocytes 1 %   Abs Immature Granulocytes 0.05 0.00 - 0.07 K/uL    Comment: Performed at [facility], [address] [place], [place] [address]  Comprehensive metabolic panel     Status: Abnormal   Collection Time: 05/20/20  6:08 AM  Result Value Ref  Range   Sodium 139 135 - 145 mmol/L   Potassium 3.8 3.5 - 5.1 mmol/L   Chloride 105 98 - 111 mmol/L   CO2 25 22 - 32 mmol/L   Glucose, Bld 115 (H) 70 - 99 mg/dL    Comment: Glucose reference range applies only to samples taken after fasting for at least 8 hours.   BUN 12 6 - 20 mg/dL   Creatinine, Ser 3.71 0.44 - 1.00 mg/dL   Calcium 8.9 8.9 - 69.6 mg/dL   Total Protein 6.7 6.5 - 8.1 g/dL   Albumin 3.6 3.5 - 5.0 g/dL   AST 16 15 - 41 U/L   ALT 15 0 - 44 U/L   Alkaline Phosphatase 42 38 - 126 U/L   Total Bilirubin 0.5 0.3 - 1.2 mg/dL   GFR calc non Af Amer >60 >60 mL/min   GFR calc Af Amer >60 >60 mL/min   Anion gap 9 5 - 15    Comment: Performed at [facility], [address], [place], [place] [address]  Protime-INR     Status: None   Collection Time: 05/20/20  6:08 AM  Result Value Ref Range   Prothrombin Time 12.8 11.4 - 15.2 seconds   INR 1.0 0.8 - 1.2    Comment: (NOTE) INR goal varies based on device and disease states. Performed at [facility], [address], [place], [place] [address]   APTT     Status: None   Collection Time: 05/20/20  6:08 AM  Result Value Ref Range   aPTT 33 24 - 36 seconds    Comment: Performed at [facility], [address], [place], [place] [address]  Type and screen Order type and screen if day of surgery is less than 15 days from draw of preadmission visit or order morning of surgery if day of surgery is greater than 6 days from preadmission visit.     Status: None   Collection Time: 05/20/20  6:35 AM  Result Value Ref Range   ABO/RH(D) AB POS    Antibody Screen NEG    Sample Expiration      05/23/2020,2359 Performed at [facility], [address], [place], [place] [address]   Pregnancy, urine POC     Status: None   Collection Time: 05/20/20  6:54 AM  Result Value Ref Range   Preg Test, Ur NEGATIVE NEGATIVE    Comment:        THE SENSITIVITY OF THIS METHODOLOGY IS >24 mIU/mL    No results found.  ROS  No recent fever, bleeding abnormalities, urologic dysfunction, GI problems,  or weight gain.  Blood pressure (!) 150/97, pulse 63, temperature 98.2 F (36.8 C), temperature source Oral, resp. rate 18, height 5\' 1"  (1.549 m), weight (!) 104.3 kg, last menstrual period 05/18/2020, SpO2 94 %. Physical Exam NCAT RRR CTA L&RLE  No traumatic wounds, ecchymosis, or rash  Tender pelvic brim  Sens DPN, SPN, TN intact  Motor EHL, ext, flex, evers 5/5  DP 2+, PT 2+, No significant edema    Assessment/Plan  Symptomatic hardware pelvis  I discussed with the patient the risks and benefits of surgery, including the possibility of infection, nerve injury, vessel injury, wound breakdown, arthritis, symptomatic hardware, DVT/ PE, loss of motion, malunion, nonunion, and need for further surgery among others. She acknowledged these risks and wished to proceed.  05/20/2020, MD [facility], [facility] [phone]  05/20/2020, 8:10 AM  [facility] [address] [place] [phone] (F)

## 2020-05-20 NOTE — Transfer of Care (Signed)
Immediate Anesthesia Transfer of Care Note  Patient: Penny Long  Procedure(s) Performed: HARDWARE REMOVAL ANTERIOR PELVIS AND RT SI SCREWS (Right )  Patient Location: PACU  Anesthesia Type:General  Level of Consciousness: awake, drowsy and patient cooperative  Airway & Oxygen Therapy: Patient Spontanous Breathing and Patient connected to face mask oxygen  Post-op Assessment: Report given to RN, Post -op Vital signs reviewed and stable and Patient moving all extremities  Post vital signs: Reviewed and stable  Last Vitals:  Vitals Value Taken Time  BP 147/84 05/20/20 1106  Temp    Pulse 63 05/20/20 1106  Resp 18 05/20/20 1106  SpO2 98 % 05/20/20 1106  Vitals shown include unvalidated device data.  Last Pain:  Vitals:   05/20/20 0710  TempSrc:   PainSc: 3       Patients Stated Pain Goal: 2 (05/20/20 0710)  Complications: No complications documented.

## 2020-05-20 NOTE — OR Nursing (Signed)
During procedure of removing retained hardware, 2 Synthes 8.0 Cannulated screws were removed which were not charted during the procedure done on 4- 2018. Also explanted were 4 broken screws with 4 broken pieces remaining in the pelvic bone.

## 2020-05-20 NOTE — Anesthesia Preprocedure Evaluation (Signed)
Anesthesia Evaluation  Patient identified by MRN, date of birth, ID band Patient awake    Reviewed: Allergy & Precautions, H&P , NPO status , Patient's Chart, lab work & pertinent test results  Airway Mallampati: II   Neck ROM: full    Dental   Pulmonary asthma ,    breath sounds clear to auscultation       Cardiovascular negative cardio ROS   Rhythm:regular Rate:Normal     Neuro/Psych  Headaches, PSYCHIATRIC DISORDERS Depression    GI/Hepatic GERD  ,  Endo/Other    Renal/GU      Musculoskeletal   Abdominal   Peds  Hematology   Anesthesia Other Findings   Reproductive/Obstetrics                             Anesthesia Physical Anesthesia Plan  ASA: II  Anesthesia Plan: General   Post-op Pain Management:    Induction: Intravenous  PONV Risk Score and Plan: 3 and Ondansetron, Dexamethasone, Midazolam and Treatment may vary due to age or medical condition  Airway Management Planned: Oral ETT  Additional Equipment:   Intra-op Plan:   Post-operative Plan: Extubation in OR  Informed Consent: I have reviewed the patients History and Physical, chart, labs and discussed the procedure including the risks, benefits and alternatives for the proposed anesthesia with the patient or authorized representative who has indicated his/her understanding and acceptance.       Plan Discussed with: CRNA, Anesthesiologist and Surgeon  Anesthesia Plan Comments:         Anesthesia Quick Evaluation

## 2020-05-20 NOTE — Anesthesia Procedure Notes (Signed)
Procedure Name: Intubation Date/Time: 05/20/2020 8:25 AM Performed by: Kyung Rudd, CRNA Pre-anesthesia Checklist: Patient identified, Emergency Drugs available, Suction available, Timeout performed and Patient being monitored Patient Re-evaluated:Patient Re-evaluated prior to induction Oxygen Delivery Method: Circle system utilized Preoxygenation: Pre-oxygenation with 100% oxygen Induction Type: IV induction Ventilation: Mask ventilation without difficulty and Oral airway inserted - appropriate to patient size Laryngoscope Size: Mac and 3 Grade View: Grade I Tube type: Oral Tube size: 7.0 mm Number of attempts: 1 Airway Equipment and Method: Stylet Placement Confirmation: ETT inserted through vocal cords under direct vision,  positive ETCO2 and breath sounds checked- equal and bilateral Secured at: 21 cm Dental Injury: Teeth and Oropharynx as per pre-operative assessment  Comments: AOI by Dorene Grebe, SRNA with Dr. Marcie Bal supervising.

## 2020-05-20 NOTE — Evaluation (Signed)
Physical Therapy Evaluation Patient Details Name: Penny Long MRN: [id number] DOB: 1986/10/12 Today's Date: 05/20/2020   History of Present Illness  Penny Long is an 34 y.o. female.s/p ORIF pelvis with SI screw fixation who has had persistent pain that has not resolved with conservative measures. Pt now s/p hardware removal on 7/29. No pertinent PMH.    Clinical Impression  Pt presented supine in bed with HOB elevated, awake and willing to participate in therapy session. Prior to admission, pt reported that she was independent with all functional mobility and ADLs. Pt lives with her spouse and children in a single level home with a level entry. At the time of evaluation, pt overall moving extremely well at a supervision level with transfers and hallway ambulation with use of RW. She did require min A to return R LE back onto bed secondary to post-op weakness. Per RN, plan is to d/c home tomorrow. PT will continue to f/u with pt acutely to progress mobility as tolerated. However, pt is ready to d/c home from a PT stand point at this time.     Follow Up Recommendations No PT follow up    Equipment Recommendations  Rolling walker with 5" wheels    Recommendations for Other Services       Precautions / Restrictions Precautions Precautions: None Restrictions Weight Bearing Restrictions: Yes RLE Weight Bearing: Weight bearing as tolerated      Mobility  Bed Mobility Overal bed mobility: Needs Assistance Bed Mobility: Supine to Sit;Sit to Supine     Supine to sit: Supervision Sit to supine: Min assist   General bed mobility comments: assistance needed to return R LE onto bed  Transfers Overall transfer level: Needs assistance Equipment used: Rolling walker (2 wheeled) Transfers: Sit to/from Stand Sit to Stand: Supervision         General transfer comment: for safety, no instability with transitions; performed x1 from EOB and x1 from  toilet  Ambulation/Gait Ambulation/Gait assistance: Supervision Gait Distance (Feet): 100 Feet Assistive device: Rolling walker (2 wheeled) Gait Pattern/deviations: Step-to pattern;Step-through pattern;Decreased step length - right;Decreased step length - left;Decreased stride length Gait velocity: reduced   General Gait Details: pt with slow, steady gait with use of RW; no instability or LOB; no physical assistance required  Stairs            Wheelchair Mobility    Modified Rankin (Stroke Patients Only)       Balance Overall balance assessment: Needs assistance Sitting-balance support: Feet supported Sitting balance-Leahy Scale: Good     Standing balance support: During functional activity;No upper extremity supported Standing balance-Leahy Scale: Fair                               Pertinent Vitals/Pain Pain Assessment: Faces Faces Pain Scale: Hurts little more Pain Location: R LE/hip Pain Descriptors / Indicators: Guarding Pain Intervention(s): Monitored during session;Repositioned;Patient requesting pain meds-RN notified    Home Living Family/patient expects to be discharged to:: Private residence Living Arrangements: Spouse/significant other;Children Available Help at Discharge: Family;Available 24 hours/day Type of Home: House Home Access: Level entry     Home Layout: One level Home Equipment: Shower seat;Wheelchair - manual      Prior Function Level of Independence: Independent               Hand Dominance   Dominant Hand: Right    Extremity/Trunk Assessment   Upper Extremity Assessment Upper Extremity  Assessment: Overall WFL for tasks assessed    Lower Extremity Assessment Lower Extremity Assessment: RLE deficits/detail RLE Deficits / Details: pt with decreased strength and AROM limitations secondary to post-op pain and weakness    Cervical / Trunk Assessment Cervical / Trunk Assessment: Normal  Communication    Communication: No difficulties  Cognition Arousal/Alertness: Awake/alert Behavior During Therapy: WFL for tasks assessed/performed Overall Cognitive Status: Within Functional Limits for tasks assessed                                        General Comments      Exercises     Assessment/Plan    PT Assessment Patient needs continued PT services  PT Problem List Decreased strength;Decreased range of motion;Decreased balance;Decreased mobility;Decreased coordination;Decreased safety awareness;Decreased knowledge of use of DME;Decreased knowledge of precautions;Pain       PT Treatment Interventions DME instruction;Gait training;Stair training;Functional mobility training;Therapeutic activities;Therapeutic exercise;Neuromuscular re-education;Balance training;Patient/family education    PT Goals (Current goals can be found in the Care Plan section)  Acute Rehab PT Goals Patient Stated Goal: decrease pain PT Goal Formulation: With patient/family Time For Goal Achievement: 06/03/20 Potential to Achieve Goals: Good    Frequency Min 3X/week   Barriers to discharge        Co-evaluation               AM-PAC PT "6 Clicks" Mobility  Outcome Measure Help needed turning from your back to your side while in a flat bed without using bedrails?: None Help needed moving from lying on your back to sitting on the side of a flat bed without using bedrails?: None Help needed moving to and from a bed to a chair (including a wheelchair)?: None Help needed standing up from a chair using your arms (e.g., wheelchair or bedside chair)?: None Help needed to walk in hospital room?: None Help needed climbing 3-5 steps with a railing? : A Little 6 Click Score: 23    End of Session   Activity Tolerance: Patient tolerated treatment well Patient left: in bed;with call bell/phone within reach;with family/visitor present;Other (comment) (RN present) Nurse Communication: Mobility  status PT Visit Diagnosis: Other abnormalities of gait and mobility (R26.89)    Time: 2458-0998 PT Time Calculation (min) (ACUTE ONLY): 41 min   Charges:   PT Evaluation $PT Eval Moderate Complexity: 1 Mod PT Treatments $Gait Training: 8-22 mins $Therapeutic Activity: 8-22 mins        Arletta Bale, DPT  Acute Rehabilitation Services Pager [phone] Office [phone]    Penny Long 05/20/2020, 4:52 PM

## 2020-05-21 ENCOUNTER — Encounter (HOSPITAL_COMMUNITY): Payer: Self-pay | Admitting: Orthopedic Surgery

## 2020-05-21 DIAGNOSIS — T84218A Breakdown (mechanical) of internal fixation device of other bones, initial encounter: Secondary | ICD-10-CM | POA: Diagnosis not present

## 2020-05-21 LAB — BASIC METABOLIC PANEL
Anion gap: 11 (ref 5–15)
Anion gap: 13 (ref 5–15)
BUN: 10 mg/dL (ref 6–20)
BUN: 11 mg/dL (ref 6–20)
CO2: 20 mmol/L — ABNORMAL LOW (ref 22–32)
CO2: 19 mmol/L — ABNORMAL LOW (ref 22–32)
Calcium: 9.1 mg/dL (ref 8.9–10.3)
Calcium: 8.9 mg/dL (ref 8.9–10.3)
Chloride: 105 mmol/L (ref 98–111)
Chloride: 106 mmol/L (ref 98–111)
Creatinine, Ser: 0.8 mg/dL (ref 0.44–1.00)
Creatinine, Ser: 0.9 mg/dL (ref 0.44–1.00)
GFR calc Af Amer: >60 mL/min (ref >60)
GFR calc Af Amer: >60 mL/min (ref >60)
GFR calc non Af Amer: >60 mL/min (ref >60)
GFR calc non Af Amer: >60 mL/min (ref >60)
Glucose, Bld: 111 mg/dL — ABNORMAL HIGH (ref 70–99)
Glucose, Bld: 165 mg/dL — ABNORMAL HIGH (ref 70–99)
Potassium: 5.9 mmol/L — ABNORMAL HIGH (ref 3.5–5.1)
Potassium: 4 mmol/L (ref 3.5–5.1)
Sodium: 136 mmol/L (ref 135–145)
Sodium: 138 mmol/L (ref 135–145)

## 2020-05-21 MED ORDER — HYDROCODONE-ACETAMINOPHEN 5-325 MG PO TABS: 1.0000 | ORAL_TABLET | Freq: Four times a day (QID) | ORAL | 0 refills | Status: AC | PRN

## 2020-05-21 MED ORDER — CALCIUM CARBONATE ANTACID 500 MG PO CHEW: 400.0000 mg | CHEWABLE_TABLET | Freq: Four times a day (QID) | ORAL | Status: DC | PRN

## 2020-05-21 MED ORDER — ACETAMINOPHEN 500 MG PO TABS: 500.0000 mg | ORAL_TABLET | Freq: Two times a day (BID) | ORAL | 0 refills | Status: AC

## 2020-05-21 MED ORDER — ASPIRIN EC 325 MG PO TBEC: 325.0000 mg | DELAYED_RELEASE_TABLET | Freq: Every day | ORAL | 0 refills | Status: AC

## 2020-05-21 MED ORDER — PANTOPRAZOLE SODIUM 40 MG IV SOLR: 40.0000 mg | Freq: Every morning | INTRAVENOUS | Status: DC

## 2020-05-21 MED ORDER — CALCIUM CARBONATE ANTACID 500 MG PO CHEW: 1.0000 | CHEWABLE_TABLET | Freq: Two times a day (BID) | ORAL | Status: DC | PRN

## 2020-05-21 MED ORDER — METHOCARBAMOL 750 MG PO TABS: 750.0000 mg | ORAL_TABLET | Freq: Four times a day (QID) | ORAL | 0 refills | Status: AC | PRN

## 2020-05-21 MED ORDER — KETOROLAC TROMETHAMINE 10 MG PO TABS: 10.0000 mg | ORAL_TABLET | Freq: Four times a day (QID) | ORAL | 0 refills | Status: AC | PRN

## 2020-05-21 MED ORDER — DOCUSATE SODIUM 100 MG PO CAPS: 100.0000 mg | ORAL_CAPSULE | Freq: Two times a day (BID) | ORAL | 0 refills | Status: AC

## 2020-05-21 MED ORDER — PANTOPRAZOLE SODIUM 40 MG PO TBEC
40.0000 mg | DELAYED_RELEASE_TABLET | Freq: Every day | ORAL | Status: DC
  Administered 2020-05-21: 40 mg via ORAL
  Filled 2020-05-21: qty 1

## 2020-05-21 MED ORDER — CALCIUM CARBONATE ANTACID 500 MG PO CHEW
400.0000 mg | CHEWABLE_TABLET | Freq: Two times a day (BID) | ORAL | Status: DC | PRN
  Administered 2020-05-21: 400 mg via ORAL
  Filled 2020-05-21: qty 2

## 2020-05-21 MED FILL — HYDROCODONE-ACETAMINOPHEN 5: 5-325 | 5 days supply | Qty: 40 | Fill #0

## 2020-05-21 MED FILL — ACETAMINOPHEN 500MG XT STRE: 500 | 15 days supply | Qty: 30 | Fill #0

## 2020-05-21 MED FILL — ASPIRIN EC 325 MG TABLET: 325 | 30 days supply | Qty: 30 | Fill #0

## 2020-05-21 MED FILL — METHOCARBAMOL 750 MG TABS: 750 | 10 days supply | Qty: 40 | Fill #0

## 2020-05-21 MED FILL — DOCUSATE SODIUM 100 MG CAPS: 100 | 5 days supply | Qty: 10 | Fill #0
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
## 2020-05-21 NOTE — Discharge Instructions (Signed)
[facility] Discharge Instructions   General Discharge Instructions   WEIGHT BEARING STATUS: Weight-bear as tolerated bilateral lower extremities use crutches if needed  RANGE OF MOTION/ACTIVITY: Unrestricted range of motion, slowly increase activity level.  You will be sore for several weeks   Wound Care: Daily wound care starting on 05/23/2020.  Discharge Wound Care Instructions  Do NOT apply any ointments, solutions or lotions to pin sites or surgical wounds.  These prevent needed drainage and even though solutions like hydrogen peroxide kill bacteria, they also damage cells lining the pin sites that help fight infection.  Applying lotions or ointments can keep the wounds moist and can cause them to breakdown and open up as well. This can increase the risk for infection. When in doubt call the office.  Surgical incisions should be dressed daily.  If any drainage is noted, use one layer of adaptic, then gauze, and tape.  Alternatively you can use a Mepilex type dressing which was the base dressing that you have on currently.  This can be found at the local medical supply store  Once the incision is completely dry and without drainage, it may be left open to air out.  Showering may begin 36-48 hours later.  Cleaning gently with soap and water.    DVT/PE prophylaxis: Aspirin 325 mg daily x 4 weeks  Diet: as you were eating previously.  Can use over the counter stool softeners and bowel preparations, such as Miralax, to help with bowel movements.  Narcotics can be constipating.  Be sure to drink plenty of fluids  PAIN MEDICATION USE AND EXPECTATIONS  You have likely been given narcotic medications to help control your pain.  After a traumatic event that results in an fracture (broken bone) with or without surgery, it is ok to use narcotic pain medications to help control one's pain.  We understand that everyone responds to pain differently and each individual patient  will be evaluated on a regular basis for the continued need for narcotic medications. Ideally, narcotic medication use should last no more than 6-8 weeks (coinciding with fracture healing).   As a patient it is your responsibility as well to monitor narcotic medication use and report the amount and frequency you use these medications when you come to your office visit.   We would also advise that if you are using narcotic medications, you should take a dose prior to therapy to maximize you participation.  IF YOU ARE ON NARCOTIC MEDICATIONS IT IS NOT PERMISSIBLE TO OPERATE A MOTOR VEHICLE (MOTORCYCLE/CAR/TRUCK/MOPED) OR HEAVY MACHINERY DO NOT MIX NARCOTICS WITH OTHER CNS (CENTRAL NERVOUS SYSTEM) DEPRESSANTS SUCH AS ALCOHOL   STOP SMOKING OR USING NICOTINE PRODUCTS!!!!  As discussed nicotine severely impairs your body's ability to heal surgical and traumatic wounds but also impairs bone healing.  Wounds and bone heal by forming microscopic blood vessels (angiogenesis) and nicotine is a vasoconstrictor (essentially, shrinks blood vessels).  Therefore, if vasoconstriction occurs to these microscopic blood vessels they essentially disappear and are unable to deliver necessary nutrients to the healing tissue.  This is one modifiable factor that you can do to dramatically increase your chances of healing your injury.    (This means no smoking, no nicotine gum, patches, etc)  DO NOT USE NONSTEROIDAL ANTI-INFLAMMATORY DRUGS (NSAID'S)  Using products such as Advil (ibuprofen), Aleve (naproxen), Motrin (ibuprofen) for additional pain control during fracture healing can delay and/or prevent the healing response.  If you would like to take over the counter (  OTC) medication, Tylenol (acetaminophen) is ok.  However, some narcotic medications that are given for pain control contain acetaminophen as well. Therefore, you should not exceed more than 4000 mg of tylenol in a day if you do not have liver disease.  Also note  that there are may OTC medicines, such as cold medicines and allergy medicines that my contain tylenol as well.  If you have any questions about medications and/or interactions please ask your doctor/PA or your pharmacist.      ICE AND ELEVATE INJURED/OPERATIVE EXTREMITY  Using ice and elevating the injured extremity above your heart can help with swelling and pain control.  Icing in a pulsatile fashion, such as 20 minutes on and 20 minutes off, can be followed.    Do not place ice directly on skin. Make sure there is a barrier between to skin and the ice pack.    Using frozen items such as frozen peas works well as the conform nicely to the are that needs to be iced.  USE AN ACE WRAP OR TED HOSE FOR SWELLING CONTROL  In addition to icing and elevation, Ace wraps or TED hose are used to help limit and resolve swelling.  It is recommended to use Ace wraps or TED hose until you are informed to stop.    When using Ace Wraps start the wrapping distally (farthest away from the body) and wrap proximally (closer to the body)   Example: If you had surgery on your leg or thing and you do not have a splint on, start the ace wrap at the toes and work your way up to the thigh        If you had surgery on your upper extremity and do not have a splint on, start the ace wrap at your fingers and work your way up to the upper arm  IF YOU ARE IN A SPLINT OR CAST DO NOT REMOVE IT FOR ANY REASON   If your splint gets wet for any reason please contact the office immediately. You may shower in your splint or cast as long as you keep it dry.  This can be done by wrapping in a cast cover or garbage back (or similar)  Do Not stick any thing down your splint or cast such as pencils, money, or hangers to try and scratch yourself with.  If you feel itchy take benadryl as prescribed on the bottle for itching  IF YOU ARE IN A CAM BOOT (BLACK BOOT)  You may remove boot periodically. Perform daily dressing changes as noted  below.  Wash the liner of the boot regularly and wear a sock when wearing the boot. It is recommended that you sleep in the boot until told otherwise    Call office for the following:  Temperature greater than 101F  Persistent nausea and vomiting  Severe uncontrolled pain  Redness, tenderness, or signs of infection (pain, swelling, redness, odor or green/yellow discharge around the site)  Difficulty breathing, headache or visual disturbances  Hives  Persistent dizziness or light-headedness  Extreme fatigue  Any other questions or concerns you may have after discharge  In an emergency, call 911 or go to an Emergency Department at a nearby hospital    CALL THE OFFICE WITH ANY QUESTIONS OR CONCERNS: [phone]   VISIT OUR WEBSITE FOR ADDITIONAL INFORMATION: [email or web address]

## 2020-05-21 NOTE — Progress Notes (Signed)
[facility] Progress Note  Patient ID: Penny Long MRN: [id number] DOB/AGE: 02/22/1986 34 y.o.  Subjective:  Doing great Pain already feels better and her left hip motion is better by her report She is already ambulated 100 feet, no issues Ready to go home  Initial potassium this morning was elevated.  I did repeat the lab prior to rounding and her repeat potassium level is normal  ROS As above  Objective:   VITALS:   Vitals:   05/20/20 1527 05/20/20 1917 05/21/20 0440 05/21/20 0729  BP: (!) 137/81 (!) 134/73 (!) 141/80 116/79  Pulse: 60 64 57 56  Resp: 17 16 16 18   Temp: 97.7 F (36.5 C) 98.4 F (36.9 C) 98.6 F (37 C) 98.3 F (36.8 C)  TempSrc: Oral Oral Oral Oral  SpO2: 99% 98% 100% 100%  Weight:      Height:        Estimated body mass index is 43.46 kg/m as calculated from the following:   Height as of this encounter: 5\' 1"  (1.549 m).   Weight as of this encounter: 104.3 kg.   Intake/Output      07/29 0701 - 07/30 0700 07/30 0701 - 07/31 0700   P.O.  360   I.V. (mL/kg) 1173.1 (11.2)    IV Piggyback 134.8    Total Intake(mL/kg) 1307.9 (12.5) 360 (3.5)   Blood 50    Total Output 50    Net +1257.9 +360        Urine Occurrence 3 x      LABS  Results for orders placed or performed during the hospital encounter of 05/20/20 (from the past 24 hour(s))  Basic metabolic panel     Status: Abnormal   Collection Time: 05/21/20  4:36 AM  Result Value Ref Range   Sodium 136 135 - 145 mmol/L   Potassium 5.9 (H) 3.5 - 5.1 mmol/L   Chloride 105 98 - 111 mmol/L   CO2 20 (L) 22 - 32 mmol/L   Glucose, Bld 111 (H) 70 - 99 mg/dL   BUN 10 6 - 20 mg/dL   Creatinine, Ser 05/22/20 0.44 - 1.00 mg/dL   Calcium 9.1 8.9 - 05/23/20 mg/dL   GFR calc non Af Amer >60 >60 mL/min   GFR calc Af Amer >60 >60 mL/min   Anion gap 11 5 - 15  Basic metabolic panel     Status: Abnormal   Collection  Time: 05/21/20 10:01 AM  Result Value Ref Range   Sodium 138 135 - 145 mmol/L   Potassium 4.0 3.5 - 5.1 mmol/L   Chloride 106 98 - 111 mmol/L   CO2 19 (L) 22 - 32 mmol/L   Glucose, Bld 165 (H) 70 - 99 mg/dL   BUN 11 6 - 20 mg/dL   Creatinine, Ser 45.9 0.44 - 1.00 mg/dL   Calcium 8.9 8.9 - 05/23/20 mg/dL   GFR calc non Af Amer >60 >60 mL/min   GFR calc Af Amer >60 >60 mL/min   Anion gap 13 5 - 15     PHYSICAL EXAM:   Gen: Resting comfortably in bed, no acute distress, appears well Lungs: Unlabored Cardiac: Regular Pelvis: Dressings are stable  Bilateral distal motor and sensory function intact  No acute findings noted  No swelling  Palpable peripheral pulses  Extremities are warm  No deep calf tenderness  Assessment/Plan: 1 Day Post-Op   Active Problems:   Painful orthopaedic hardware (HCC)   Anti-infectives (From admission, onward)   Start     Dose/Rate Route Frequency Ordered Stop   05/20/20 1600  ceFAZolin (ANCEF) IVPB 1 g/50 mL premix        1 g 100 mL/hr over 30 Minutes Intravenous Every 6 hours 05/20/20 1538 05/21/20 0403   05/20/20 1030  vancomycin (VANCOCIN) powder  Status:  Discontinued          As needed 05/20/20 1030 05/20/20 1058   05/20/20 0615  ceFAZolin (ANCEF) IVPB 2g/100 mL premix        2 g 200 mL/hr over 30 Minutes Intravenous On call to O.R. 05/20/20 9518 05/20/20 0845    .  POD/HD#: 45  34 year old female s/p remote repair of complex pelvic ring injury with painful pelvic hardware s/p removal of hardware   -Symptomatic pelvic hardware s/p removal  Dressing changes on 05/23/2020  Wound care reviewed with the patient  Weight-bear as tolerated bilateral lower extremities  Range of motion restrictions  Slowly increase activities as tolerated  - Pain management:  Short course of Toradol  5 to 7-day course of Percocet with a quick taper off  Robaxin  - ABL anemia/Hemodynamics  Stable  - DVT/PE prophylaxis:  Aspirin 325 mg daily x 4  weeks  - ID:   Perioperative antibiotics completed  - Activity:  As tolerated, no restrictions  Increase activity slowly  - FEN/GI prophylaxis/Foley/Lines:  Regular diet  - Dispo:  Discharge home today, follow-up with orthopedics in 2 weeks    Mearl Latin, PA-C [phone] (C) 05/21/2020, 12:40 PM  [facility] [address] [place] [address] [phone] Collier Bullock (F)

## 2020-05-21 NOTE — Progress Notes (Signed)
Patient discharging home today. Discharge instructions explained to patient including f/u appointment and wound care per MD and she verbalized understanding. Extra mepilex dressing given to patient. Patient took a shower prior to discharge and she stated that the  PA told her it was ok. Took all her belongings. Meds delivered to room by [facility] pharmacy. No further questions or concerns voiced.

## 2020-05-22 NOTE — Op Note (Signed)
NAME: AVALEEN, BROWNLEY MEDICAL RECORD NI:[id number] ACCOUNT [id number] DATE OF BIRTH:11/21/1985 FACILITY: [facility] LOCATION: [facility] PHYSICIAN:Brendon Christoffel H. Aurel Nguyen, MD  OPERATIVE REPORT  DATE OF PROCEDURE:  05/20/2020  PREOPERATIVE DIAGNOSES: 1.  Broken symptomatic hardware, anterior pelvic ring. 2.  Symptomatic transsacral iliac screws.  POSTOPERATIVE DIAGNOSES: 1.  Broken symptomatic hardware, anterior pelvic ring. 2.  Symptomatic transsacral iliac screws.  PROCEDURES: 1.  Removal of deep implant, anterior pelvic brim. 2.  Removal of sacroiliac screws, right iliac, with inability to remove associated washers.  SURGEON:  Myrene Galas, MD  ASSISTANT:  None.  ANESTHESIA:  General.  TOURNIQUET:  None.  ESTIMATED BLOOD LOSS:  120 mL.  DISPOSITION:  To PACU.  CONDITION:  Stable.  INDICATIONS FOR PROCEDURE:  The patient is a very pleasant 34 year old female who underwent ORIF of her pelvic ring with transsacral screw fixation from right to left of a severe pelvis injury resulting in anterior and posterior instability.  She did go  on to unite ultimately, but not before breaking several screws anteriorly and several more did loosen.  The patient had noted dyspareunia and some other concerns that failed to resolve with conservative measures.  I discussed with her the risks and  benefits of removal of hardware from both locations, including the possibility of leaving the SI screws and removing the brim, but she wished to proceed with all of the above.  Furthermore, we specifically discussed the washers which I did not anticipate  successfully being able to remove, but which I would try to go with a reverse threaded pin-type removal apparatus.  Risks included nerve injury, vessel injury, infection, failure to alleviate symptoms, and need for further surgery, among others.  She  did provide consent to proceed.  BRIEF SUMMARY OF PROCEDURE:  The patient was taken to the operating room where  general anesthesia was induced.  She was positioned supine and a chlorhexidine wash, Betadine scrub and paint was performed of the anterior brim and right flank.  After  timeout, I turned my attention to the SI screws and remade the incision here and inserted the long guidewire down into the head of the screw and across into the screw itself.  This was checked on multiple views using the [employer] [facility] equipment.  I  engaged the screw and withdrew it.  In similar fashion, I found the other transsacral screw and withdrew it.  I then advanced the [facility] reverse threaded cone appropriate size for engaging the washer into this hole and was able to turn this in a  counterclockwise fashion and see the thread.  However, the bone had grown over the washer to such a degree that I was unable to extract it.  As it was encased in bone and could not be extracted without subjecting her to significant risk, decision was  made to leave them in place, which again was anticipated preoperatively and discussed with the patient.  Wound was irrigated, closed with 2-0 nylon suture.  I then turned my attention to the anterior pelvic brim and remade the Pfannenstiel incision containing dissection carefully down to the rectus.  This was divided, encountering the screws.  Each was removed without complication.  They were broken off  within the bone and were not prominent in any location.  After removal of the pelvic brim plate, I continued along the interior of the acetabulum and was able to expose the screw heads for this plate placed through the Stoppa approach and removed each of  the screws as well  as the plate.  I did not encounter any purulence or other concern for infection or abnormal process.  The wounds were irrigated thoroughly and closed in standard layered fashion using interrupted #1 figure-of-eight for the rectus,  then a deep 0 Vicryl, 2-0 Vicryl and 2-0 nylon.  Sterile gently compressive dressings were applied.  The  patient was awakened from anesthesia and transported to PACU in stable condition.  PROGNOSIS:  The patient will be weightbearing as tolerated bilaterally.  We will plan to see her back in the office for removal of sutures in 14 days.  We are hopeful that this removal will ameliorate her symptoms significantly.  VN/NUANCE  D:05/22/2020 T:05/22/2020 JOB:[id number]

## 2020-05-31 NOTE — Anesthesia Postprocedure Evaluation (Signed)
Anesthesia Post Note  Patient: Penny Long  Procedure(s) Performed: HARDWARE REMOVAL ANTERIOR PELVIS AND RT SI SCREWS (Right )     Patient location during evaluation: PACU Anesthesia Type: General Level of consciousness: awake and alert Pain management: pain level controlled Vital Signs Assessment: post-procedure vital signs reviewed and stable Respiratory status: spontaneous breathing, nonlabored ventilation, respiratory function stable and patient connected to nasal cannula oxygen Cardiovascular status: blood pressure returned to baseline and stable Postop Assessment: no apparent nausea or vomiting Anesthetic complications: no   No complications documented.  Last Vitals:  Vitals:   05/21/20 0440 05/21/20 0729  BP: (!) 141/80 116/79  Pulse: 57 56  Resp: 16 18  Temp: 37 C 36.8 C  SpO2: 100% 100%    Last Pain:  Vitals:   05/21/20 0930  TempSrc:   PainSc: 6                  Consuela Widener S

## 2021-05-07 IMAGING — RF DG C-ARM 1-60 MIN
1 series · 5 of 5 positions shown · non-contrast
Comparison: CT 10/10/2019.

CLINICAL DATA: Hardware removal.

EXAM:
JUDET PELVIS - 3+ VIEW; DG C-ARM 1-60 MIN

[Series 1: run · 5 of 5 slices shown]
[im 1/5]
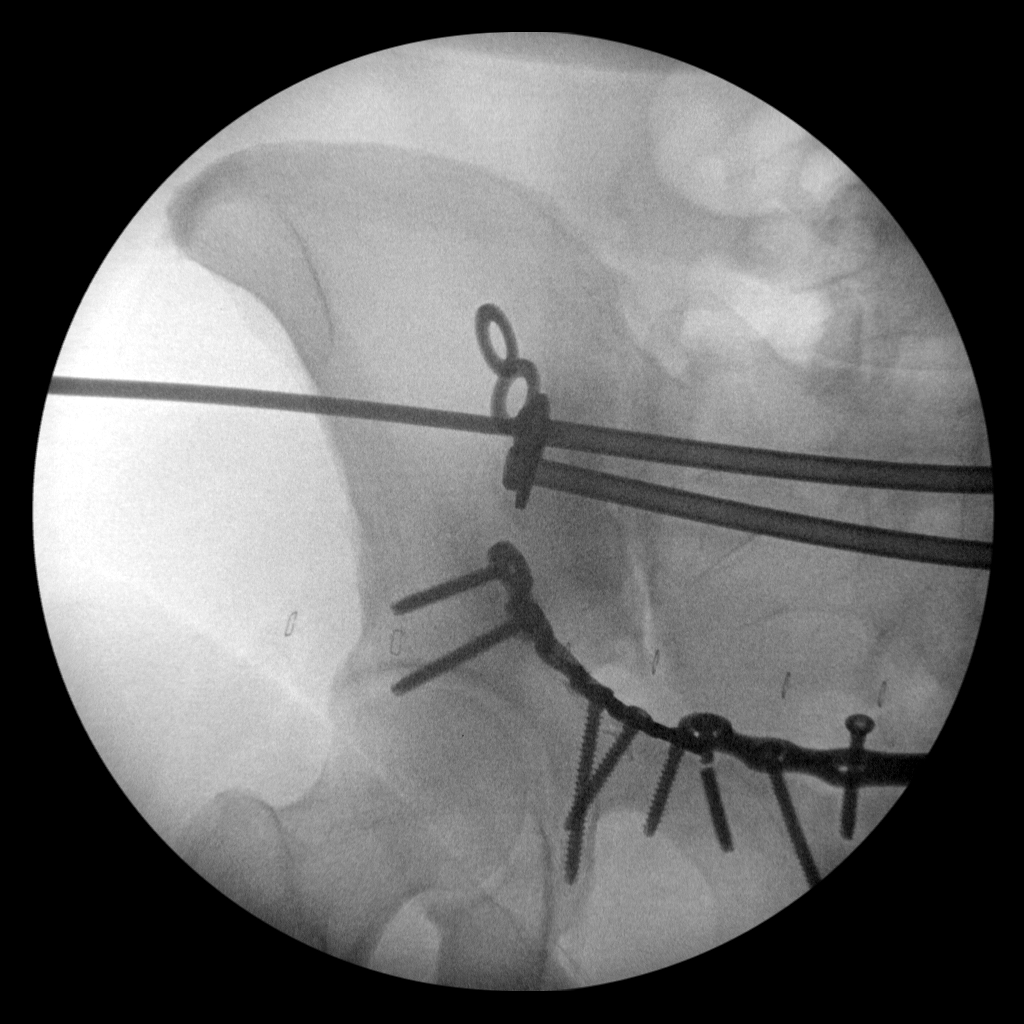
[im 2/5]
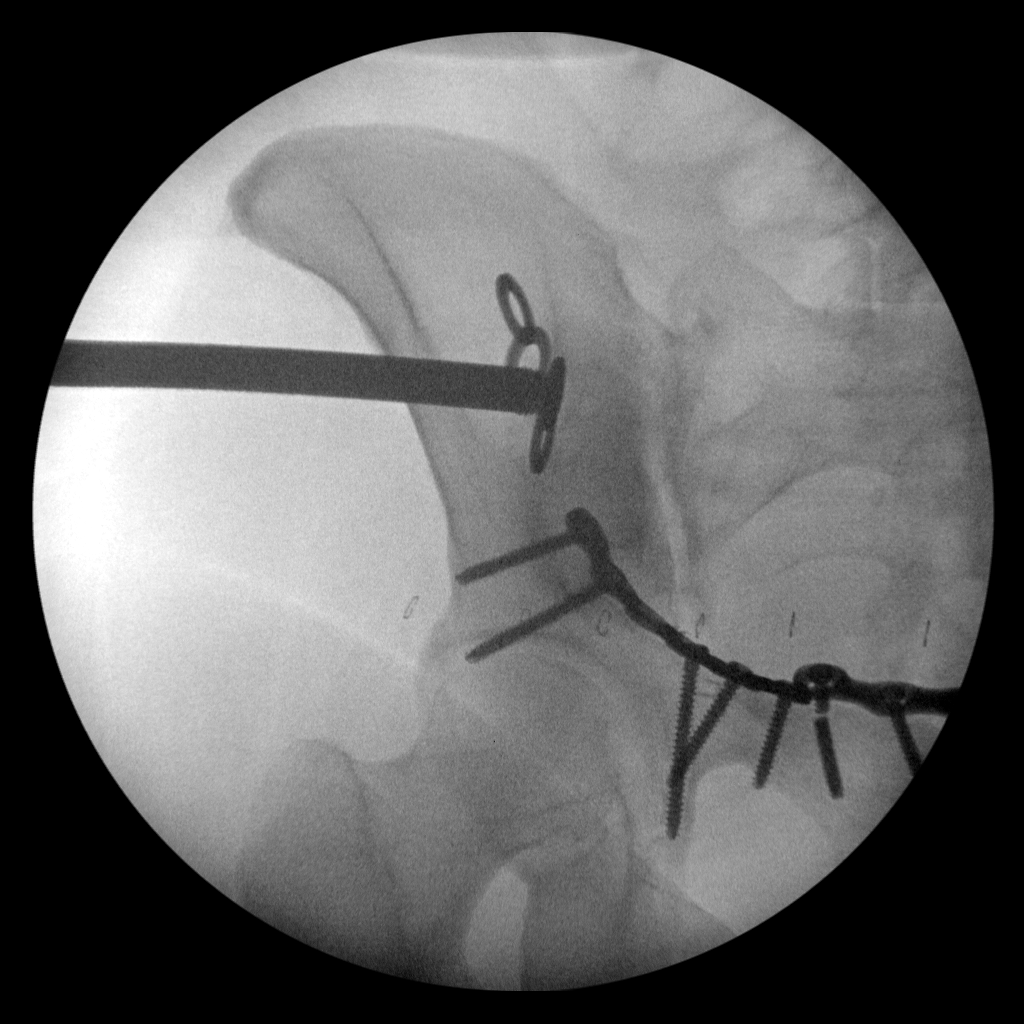
[im 3/5]
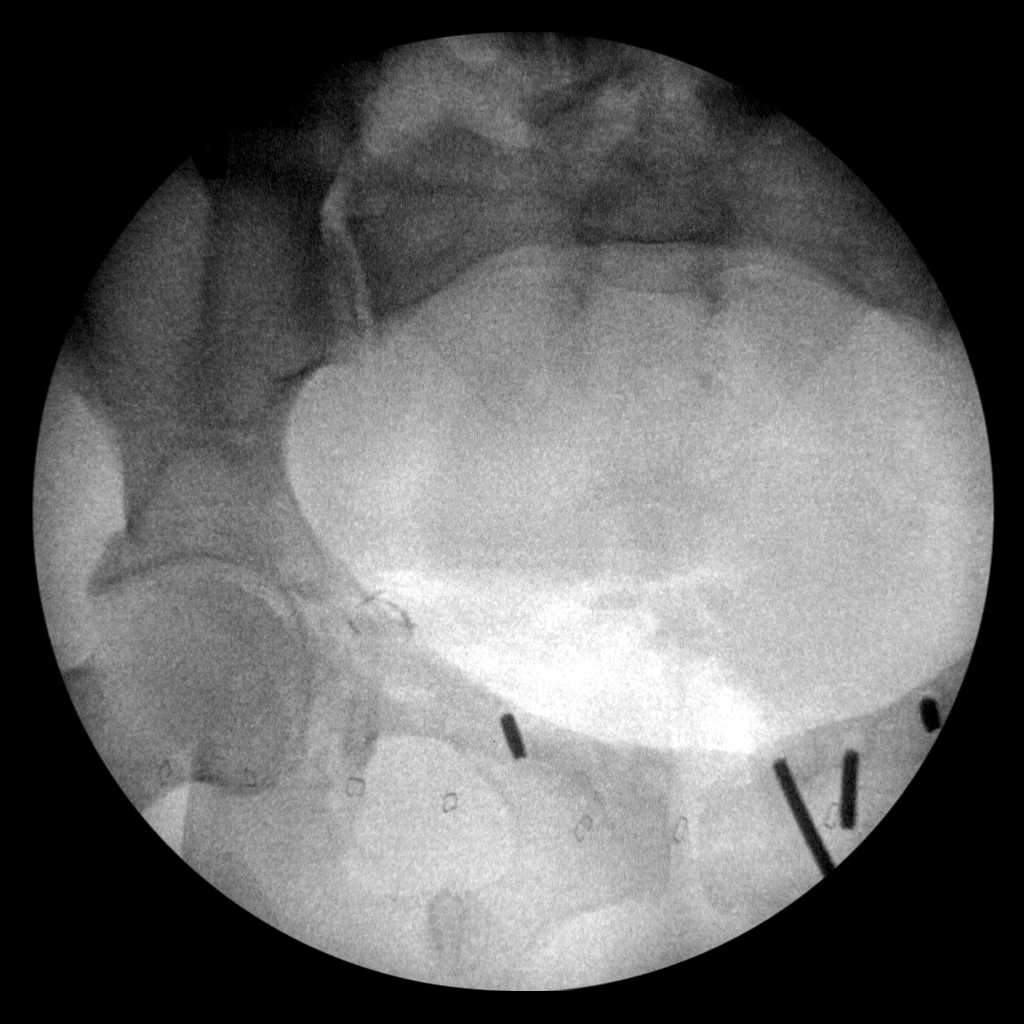
[im 4/5]
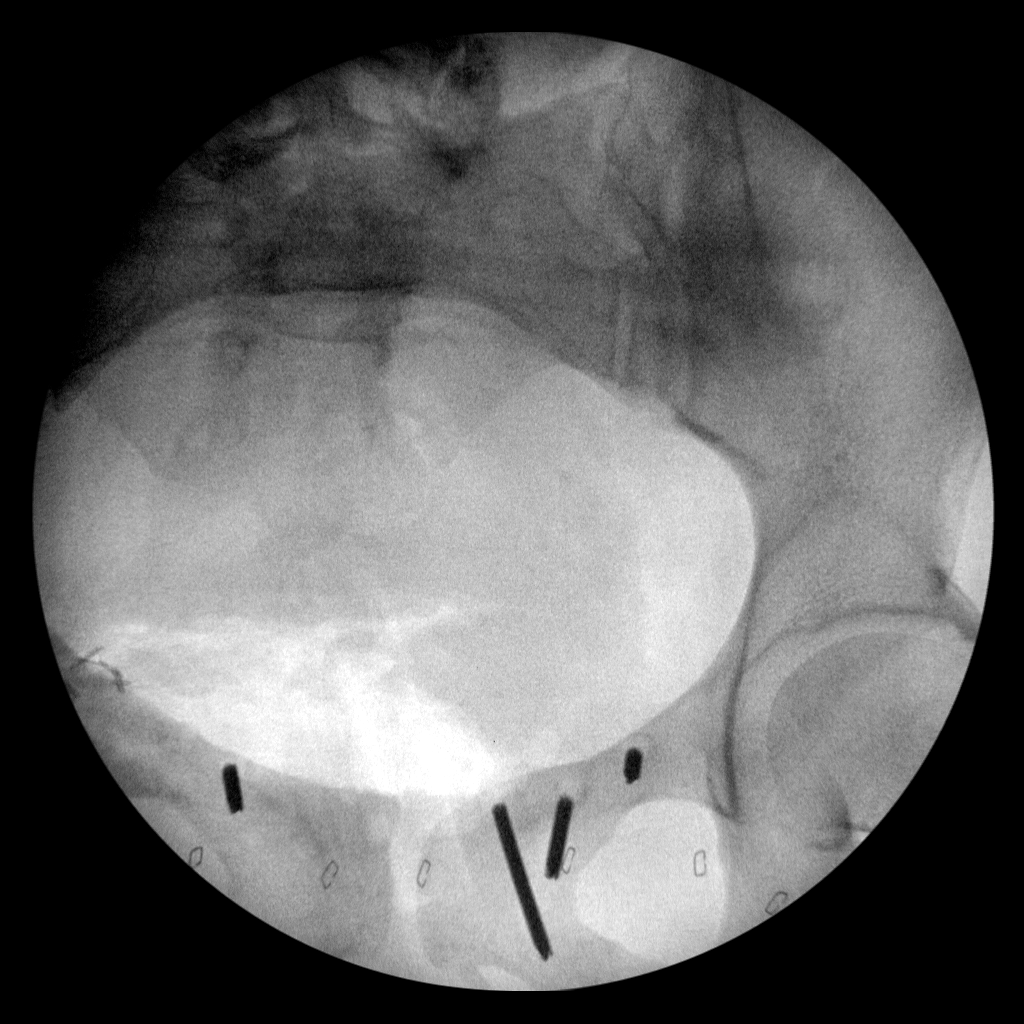
[im 5/5]
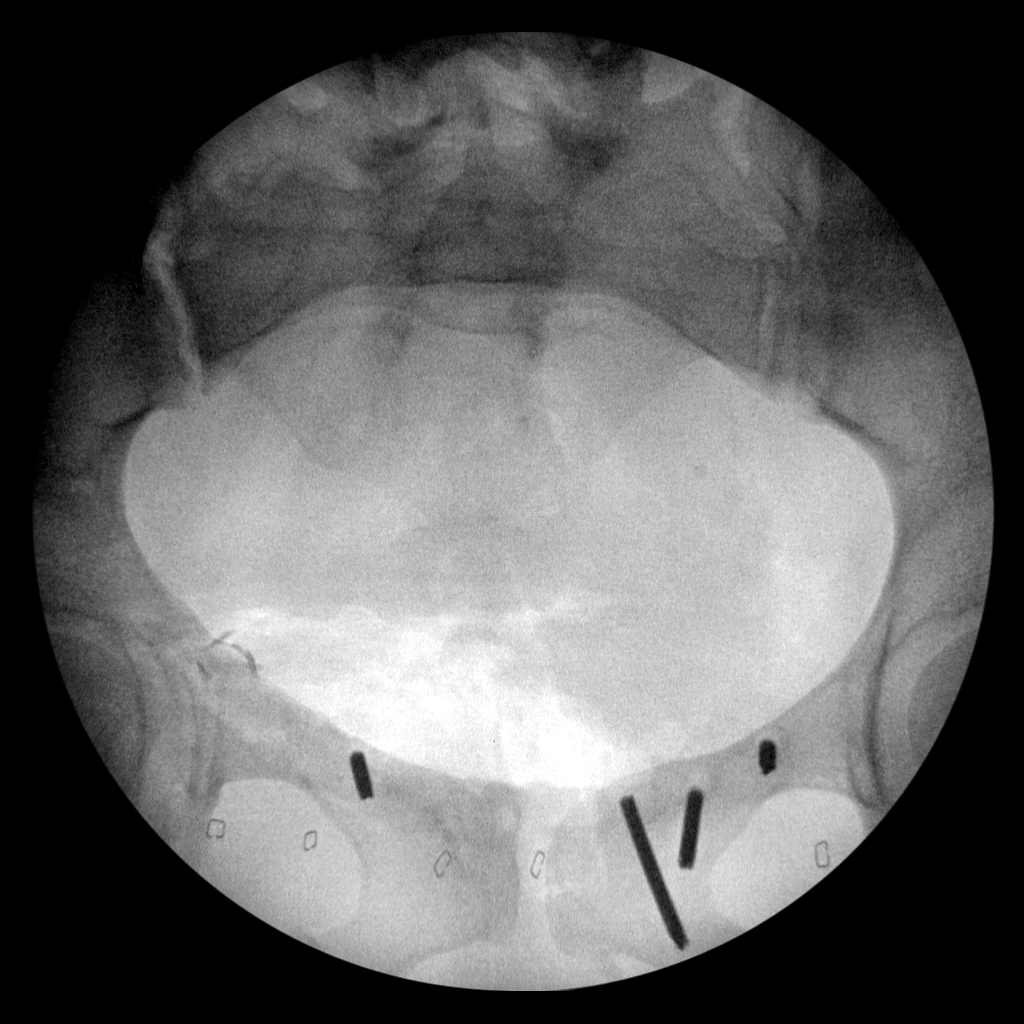

[5 of 5 positions shown; findings below may reference images not displayed]

FINDINGS: Patient has undergone surgical hardware removal. Screw fragments
remain over the right and left pubis. Visualized bony structures
appear well aligned.
IMPRESSION: Focal changes in the pelvis. Screw fragments remain over the right
and left pubis.
